# Patient Record
Sex: Female | Born: 1935 | Race: White | Hispanic: No | Smoking: Never smoker
Health system: Southern US, Community
[De-identification: ages and names within clinical notes are randomized; demographics above are authoritative.]

## PROBLEM LIST (undated history)

## (undated) DIAGNOSIS — I4891 Unspecified atrial fibrillation: Secondary | ICD-10-CM

## (undated) DIAGNOSIS — F0390 Unspecified dementia without behavioral disturbance: Secondary | ICD-10-CM

## (undated) DIAGNOSIS — I10 Essential (primary) hypertension: Secondary | ICD-10-CM

## (undated) DIAGNOSIS — E079 Disorder of thyroid, unspecified: Secondary | ICD-10-CM

## (undated) DIAGNOSIS — G459 Transient cerebral ischemic attack, unspecified: Secondary | ICD-10-CM

## (undated) HISTORY — PX: CHOLECYSTECTOMY: SHX55

---

## 2004-10-12 ENCOUNTER — Ambulatory Visit: Payer: Self-pay | Admitting: Ophthalmology

## 2004-10-18 ENCOUNTER — Ambulatory Visit: Payer: Self-pay | Admitting: Ophthalmology

## 2005-12-01 ENCOUNTER — Ambulatory Visit: Payer: Self-pay | Admitting: Internal Medicine

## 2005-12-12 ENCOUNTER — Ambulatory Visit: Payer: Self-pay | Admitting: Internal Medicine

## 2007-10-26 ENCOUNTER — Ambulatory Visit: Payer: Self-pay | Admitting: Orthopedic Surgery

## 2009-06-17 ENCOUNTER — Emergency Department: Payer: Self-pay | Admitting: Emergency Medicine

## 2009-06-18 ENCOUNTER — Inpatient Hospital Stay: Payer: Self-pay | Admitting: Internal Medicine

## 2010-08-06 ENCOUNTER — Emergency Department: Payer: Self-pay | Admitting: Emergency Medicine

## 2010-08-08 ENCOUNTER — Inpatient Hospital Stay: Payer: Self-pay | Admitting: Internal Medicine

## 2010-08-24 ENCOUNTER — Inpatient Hospital Stay: Payer: Self-pay | Admitting: Internal Medicine

## 2010-08-30 ENCOUNTER — Encounter: Payer: Self-pay | Admitting: Internal Medicine

## 2010-09-01 DIAGNOSIS — F0280 Dementia in other diseases classified elsewhere without behavioral disturbance: Secondary | ICD-10-CM

## 2010-09-01 DIAGNOSIS — I10 Essential (primary) hypertension: Secondary | ICD-10-CM

## 2010-09-01 DIAGNOSIS — G309 Alzheimer's disease, unspecified: Secondary | ICD-10-CM

## 2010-09-01 DIAGNOSIS — F329 Major depressive disorder, single episode, unspecified: Secondary | ICD-10-CM

## 2010-09-06 NOTE — Letter (Signed)
Summary: [facility] Face Sheet  [facility] Face Sheet   Imported By: Beau Fanny 09/02/2010 10:40:06  _____________________________________________________________________  External Attachment:    Type:   Image     Comment:   External Document

## 2011-01-31 ENCOUNTER — Ambulatory Visit: Payer: Self-pay | Admitting: Internal Medicine

## 2011-06-20 DIAGNOSIS — I4891 Unspecified atrial fibrillation: Secondary | ICD-10-CM

## 2011-06-20 HISTORY — DX: Unspecified atrial fibrillation: I48.91

## 2014-10-19 ENCOUNTER — Ambulatory Visit
Admission: RE | Admit: 2014-10-19 | Discharge: 2014-10-19 | Disposition: A | Discharge status: Home / Self Care | Payer: Medicare Other | Source: Ambulatory Visit | Attending: Internal Medicine | Admitting: Internal Medicine

## 2014-10-19 ENCOUNTER — Ambulatory Visit
Admission: RE | Admit: 2014-10-19 | Discharge: 2014-10-19 | Disposition: A | Discharge status: Home / Self Care | Payer: Medicare Other | Source: Ambulatory Visit | Attending: *Deleted | Admitting: *Deleted

## 2014-10-19 ENCOUNTER — Other Ambulatory Visit: Payer: Self-pay | Admitting: *Deleted

## 2014-10-19 DIAGNOSIS — R509 Fever, unspecified: Secondary | ICD-10-CM

## 2014-10-19 DIAGNOSIS — R05 Cough: Secondary | ICD-10-CM

## 2014-10-19 DIAGNOSIS — M40204 Unspecified kyphosis, thoracic region: Secondary | ICD-10-CM | POA: Insufficient documentation

## 2014-10-19 DIAGNOSIS — R059 Cough, unspecified: Secondary | ICD-10-CM

## 2014-10-19 DIAGNOSIS — M8580 Other specified disorders of bone density and structure, unspecified site: Secondary | ICD-10-CM | POA: Insufficient documentation

## 2015-02-05 ENCOUNTER — Emergency Department: Payer: Medicare Other

## 2015-02-05 ENCOUNTER — Encounter: Payer: Self-pay | Admitting: *Deleted

## 2015-02-05 ENCOUNTER — Emergency Department
Admission: EM | Admit: 2015-02-05 | Discharge: 2015-02-06 | Disposition: A | Discharge status: Home / Self Care | Payer: Medicare Other | Attending: Emergency Medicine | Admitting: Emergency Medicine

## 2015-02-05 ENCOUNTER — Other Ambulatory Visit: Payer: Self-pay

## 2015-02-05 DIAGNOSIS — G459 Transient cerebral ischemic attack, unspecified: Secondary | ICD-10-CM | POA: Insufficient documentation

## 2015-02-05 DIAGNOSIS — J159 Unspecified bacterial pneumonia: Secondary | ICD-10-CM | POA: Diagnosis not present

## 2015-02-05 DIAGNOSIS — F0390 Unspecified dementia without behavioral disturbance: Secondary | ICD-10-CM | POA: Diagnosis not present

## 2015-02-05 DIAGNOSIS — Z79899 Other long term (current) drug therapy: Secondary | ICD-10-CM | POA: Diagnosis not present

## 2015-02-05 DIAGNOSIS — I4891 Unspecified atrial fibrillation: Secondary | ICD-10-CM | POA: Insufficient documentation

## 2015-02-05 DIAGNOSIS — R4182 Altered mental status, unspecified: Secondary | ICD-10-CM | POA: Diagnosis present

## 2015-02-05 DIAGNOSIS — I10 Essential (primary) hypertension: Secondary | ICD-10-CM | POA: Diagnosis not present

## 2015-02-05 DIAGNOSIS — J189 Pneumonia, unspecified organism: Secondary | ICD-10-CM

## 2015-02-05 HISTORY — DX: Unspecified dementia, unspecified severity, without behavioral disturbance, psychotic disturbance, mood disturbance, and anxiety: F03.90

## 2015-02-05 HISTORY — DX: Disorder of thyroid, unspecified: E07.9

## 2015-02-05 HISTORY — DX: Essential (primary) hypertension: I10

## 2015-02-05 LAB — CBC
HEMATOCRIT: 40.5 % (ref 35.0–47.0)
Hemoglobin: 13.5 g/dL (ref 12.0–16.0)
MCH: 33.2 pg (ref 26.0–34.0)
MCHC: 33.4 g/dL (ref 32.0–36.0)
MCV: 99.3 fL (ref 80.0–100.0)
Platelets: 241 10*3/uL (ref 150–440)
RBC: 4.08 MIL/uL (ref 3.80–5.20)
RDW: 13.5 % (ref 11.5–14.5)
WBC: 9.6 10*3/uL (ref 3.6–11.0)

## 2015-02-05 LAB — COMPREHENSIVE METABOLIC PANEL
ALBUMIN: 3.7 g/dL (ref 3.5–5.0)
ALT: 12 U/L — AB (ref 14–54)
AST: 22 U/L (ref 15–41)
Alkaline Phosphatase: 111 U/L (ref 38–126)
Anion gap: 9 (ref 5–15)
BUN: 11 mg/dL (ref 6–20)
CHLORIDE: 99 mmol/L — AB (ref 101–111)
CO2: 27 mmol/L (ref 22–32)
Calcium: 9.2 mg/dL (ref 8.9–10.3)
Creatinine, Ser: 0.82 mg/dL (ref 0.44–1.00)
GFR calc Af Amer: >60 mL/min (ref >60)
Glucose, Bld: 111 mg/dL — ABNORMAL HIGH (ref 65–99)
POTASSIUM: 3.4 mmol/L — AB (ref 3.5–5.1)
SODIUM: 135 mmol/L (ref 135–145)
Total Bilirubin: 0.2 mg/dL — ABNORMAL LOW (ref 0.3–1.2)
Total Protein: 7.2 g/dL (ref 6.5–8.1)

## 2015-02-05 LAB — URINALYSIS COMPLETE WITH MICROSCOPIC (ARMC ONLY)
BACTERIA UA: NONE SEEN
BILIRUBIN URINE: NEGATIVE
Glucose, UA: NEGATIVE mg/dL
Hgb urine dipstick: NEGATIVE
Ketones, ur: NEGATIVE mg/dL
LEUKOCYTES UA: NEGATIVE
Nitrite: NEGATIVE
PH: 6 (ref 5.0–8.0)
PROTEIN: NEGATIVE mg/dL
SQUAMOUS EPITHELIAL / LPF: NONE SEEN
Specific Gravity, Urine: 1.016 (ref 1.005–1.030)

## 2015-02-05 LAB — TROPONIN I: Troponin I: <0.03 ng/mL (ref <0.031)

## 2015-02-05 NOTE — ED Notes (Signed)
Per EMS report, patient's daughter and staff at [facility] reported patient having increased confusion and slurred speech. Patient did not have slurred speech upon arrival and stated that she just wanted to go to her room because it was past her bedtime. Face is symmetrical. Family reported patient's urine smelled strong.

## 2015-02-06 DIAGNOSIS — G459 Transient cerebral ischemic attack, unspecified: Secondary | ICD-10-CM | POA: Diagnosis not present

## 2015-02-06 LAB — GLUCOSE, CAPILLARY: GLUCOSE-CAPILLARY: 107 mg/dL — AB (ref 65–99)

## 2015-02-06 MED ORDER — DOXYCYCLINE HYCLATE 100 MG PO TABS
100.0000 mg | ORAL_TABLET | Freq: Once | ORAL | Status: AC
  Administered 2015-02-06: 100 mg via ORAL
  Filled 2015-02-06: qty 1

## 2015-02-06 MED ORDER — DOXYCYCLINE HYCLATE 100 MG PO TABS: 100.0000 mg | ORAL_TABLET | Freq: Two times a day (BID) | ORAL | Status: DC

## 2015-02-06 NOTE — ED Provider Notes (Addendum)
[facility] Emergency Department Provider Note  ____________________________________________  Time seen: Approximately 1110pm  I have reviewed the triage vital signs and the nursing notes.   HISTORY  Chief Complaint Altered Mental Status    HPI Casey Sanford is a 81 y.o. female with a history of atrial fibrillation and dementia who presents today with several episodes of slurred speech starting at 8:30 tonight. Her daughter is at the bedside and said that she has been increasingly confused over the week. However, she had several episodes of slurred speech lasting several seconds to several minutes earlier tonight. At this point the patient says that she feels fine without any complaints. The daughter does not notice any slurred speech at this time. However, she says that the patient has been slightly confused. She usually knows her age and is not able to tell it at this time. The daughter said that she did not note any facial droop. The family has had discussions about anticoagulation with age of fibrillation the past and have opted against it because of a greater risk than benefit at this age.   Past Medical History  Diagnosis Date  . Hypertension   . Thyroid disease   . Dementia     There are no active problems to display for this patient.   Past Surgical History  Procedure Laterality Date  . Cholecystectomy      Current Outpatient Rx  Name  Route  Sig  Dispense  Refill  . docusate sodium (COLACE) 100 MG capsule   Oral   Take 100 mg by mouth 2 (two) times daily.         . furosemide (LASIX) 20 MG tablet   Oral   Take 1 tablet by mouth daily.         Marland Kitchen levothyroxine (SYNTHROID, LEVOTHROID) 50 MCG tablet   Oral   Take 1 tablet by mouth daily.         Marland Kitchen losartan (COZAAR) 50 MG tablet   Oral   Take 1 tablet by mouth daily.         Bertram Gala Glycol-Propyl Glycol (SYSTANE) 0.4-0.3 % SOLN   Ophthalmic   Apply 1 drop to eye 3 (three)  times daily.         . Potassium Chloride CR (MICRO-K) 8 MEQ CPCR capsule CR   Oral   Take 1 capsule by mouth daily.         . risperiDONE (RISPERDAL) 0.5 MG tablet   Oral   Take 1 tablet by mouth daily.           Allergies Review of patient's allergies indicates no known allergies.  No family history on file.  Social History Social History  Substance Use Topics  . Smoking status: Never Smoker   . Smokeless tobacco: None  . Alcohol Use: 0.6 oz/week    1 Glasses of wine per week     Comment: nightly    Review of Systems Constitutional: No fever/chills Eyes: No visual changes. ENT: No sore throat. Cardiovascular: Denies chest pain. Respiratory: Denies shortness of breath. Gastrointestinal: No abdominal pain.  No nausea, no vomiting.  No diarrhea.  No constipation. Genitourinary: Negative for dysuria. Musculoskeletal: Negative for back pain. Skin: Negative for rash. Neurological: Negative for headaches, focal weakness or numbness.  10-point ROS otherwise negative.  ____________________________________________   PHYSICAL EXAM:  VITAL SIGNS: ED Triage Vitals  Enc Vitals Group     BP 02/05/15 2141 184/79 mmHg  Pulse Rate 02/05/15 2141 67     Resp 02/05/15 2141 18     Temp 02/05/15 2141 97.8 F (36.6 C)     Temp Source 02/05/15 2141 Oral     SpO2 02/05/15 2141 100 %     Weight 02/05/15 2141 170 lb (77.111 kg)     Height 02/05/15 2141 5\' 3"  (1.6 m)     Head Cir --      Peak Flow --      Pain Score --      Pain Loc --      Pain Edu? --      Excl. in GC? --     Constitutional: Alert and oriented 2. Not oriented to age.. Well appearing and in no acute distress. Eyes: Conjunctivae are normal. PERRL. EOMI. Head: Atraumatic. Nose: No congestion/rhinnorhea. Mouth/Throat: Mucous membranes are moist.  Oropharynx non-erythematous. Neck: No stridor.   Cardiovascular: Normal rate, regular rhythm. Grossly normal heart sounds.  Good peripheral  circulation. Respiratory: Normal respiratory effort.  No retractions. Lungs CTAB. Gastrointestinal: Soft and nontender. No distention. No abdominal bruits. No CVA tenderness. Musculoskeletal: No lower extremity tenderness nor edema.  No joint effusions. Neurologic:  Normal speech and language. No gross focal neurologic deficits are appreciated. No gait instability. No ataxia on finger to nose testing. Skin:  Skin is warm, dry and intact. No rash noted. Psychiatric: Mood and affect are normal. Speech and behavior are normal.  NIH Stroke Scale   Interval: Baseline Person Administering Scale: Arelia Longest  Administer stroke scale items in the order listed. Record performance in each category after each subscale exam. Do not go back and change scores. Follow directions provided for each exam technique. Scores should reflect what the patient does, not what the clinician thinks the patient can do. The clinician should record answers while administering the exam and work quickly. Except where indicated, the patient should not be coached (i.e., repeated requests to patient to make a special effort).   1a  Level of consciousness: 0=alert; keenly responsive  1b. LOC questions:  0=Performs both tasks correctly  1c. LOC commands: 0=Performs both tasks correctly  2.  Best Gaze: 0=normal  3.  Visual: 0=No visual loss  4. Facial Palsy: 0=Normal symmetric movement  5a.  Motor left arm: 0=No drift, limb holds 90 (or 45) degrees for full 10 seconds  5b.  Motor right arm: 0=No drift, limb holds 90 (or 45) degrees for full 10 seconds  6a. motor left leg: 0=No drift, limb holds 90 (or 45) degrees for full 10 seconds  6b  Motor right leg:  0=No drift, limb holds 90 (or 45) degrees for full 10 seconds  7. Limb Ataxia: 0=Absent  8.  Sensory: 0=Normal; no sensory loss  9. Best Language:  0=No aphasia, normal  10. Dysarthria: 0=Normal  11. Extinction and Inattention: 0=No abnormality  12. Distal motor  function: 0=Normal   Total:   0   ____________________________________________   LABS (all labs ordered are listed, but only abnormal results are displayed)  Labs Reviewed  COMPREHENSIVE METABOLIC PANEL - Abnormal; Notable for the following:    Potassium 3.4 (*)    Chloride 99 (*)    Glucose, Bld 111 (*)    ALT 12 (*)    Total Bilirubin 0.2 (*)    All other components within normal limits  URINALYSIS COMPLETEWITH MICROSCOPIC ([facility] ONLY) - Abnormal; Notable for the following:    Color, Urine YELLOW (*)    APPearance CLEAR (*)  All other components within normal limits  CBC  TROPONIN I  CBG MONITORING, ED   ____________________________________________  EKG  ED ECG REPORT I, Brysten Reister,  Teena Irani, the attending physician, personally viewed and interpreted this ECG.   Date: 02/06/2015  EKG Time: 2209  Rate: 79  Rhythm: atrial fibrillation, rate 79  Axis: normal axis.    Intervals:none  ST&T Change: no st elevations or depressions.  No abnormal t wave inversions.    ____________________________________________  RADIOLOGY  Minimal left basilar opacity.  i personally reviewed the chest xray.  No acute findings on the ct of the brain.   ____________________________________________   PROCEDURES    ____________________________________________   INITIAL IMPRESSION / ASSESSMENT AND PLAN / ED COURSE  Pertinent labs & imaging results that were available during my care of the patient were reviewed by me and considered in my medical decision making (see chart for details).  ----------------------------------------- 12:44 AM on 02/06/2015 -----------------------------------------  Patient is resting comfortably and continues to express her wishes to go home. She does not have any slurred speech or focal weakness at this time. She has reassuring lab work. She does have what may be developing pneumonia on her chest x-ray. I discussed with her daughter Casey Sanford as well as her  daughter Casey Sanford who is the power of attorney. The family had a discussion and decided that they will be okay with the patient going back to home without any further workup for stroke or anticoagulation. We discussed that this will mean the patient is at risk for stroke. However, the patient has discussed anticoagulation with her primary care doctor and he agreed that the benefits are less than the risks. Treat the pneumonia. This is possibly a cause for her altered mental status. ____________________________________________   FINAL CLINICAL IMPRESSION(S) / ED DIAGNOSES  Acute TIA. Acute pneumonia. Initial visit.    Myrna Blazer, MD 02/06/15 0045  Course of Doxy not ideal antibiotic. However, given risk profile with Levaquin will not prescribe secondary to multiple serious side effects in this age group.  Myrna Blazer, MD 02/06/15 [id number]

## 2015-06-01 ENCOUNTER — Other Ambulatory Visit: Payer: Self-pay | Admitting: Vascular Surgery

## 2015-06-07 ENCOUNTER — Other Ambulatory Visit
Admission: RE | Admit: 2015-06-07 | Discharge: 2015-06-07 | Disposition: A | Discharge status: Home / Self Care | Payer: Medicare Other | Source: Ambulatory Visit | Attending: Vascular Surgery | Admitting: Vascular Surgery

## 2015-06-07 DIAGNOSIS — I739 Peripheral vascular disease, unspecified: Secondary | ICD-10-CM | POA: Insufficient documentation

## 2015-06-07 LAB — BUN: BUN: 16 mg/dL (ref 6–20)

## 2015-06-07 LAB — CREATININE, SERUM
Creatinine, Ser: 0.9 mg/dL (ref 0.44–1.00)
GFR calc Af Amer: >60 mL/min (ref >60)
GFR, EST NON AFRICAN AMERICAN: 55 mL/min — AB (ref >60)

## 2015-06-09 ENCOUNTER — Ambulatory Visit
Admission: RE | Admit: 2015-06-09 | Discharge: 2015-06-09 | Disposition: A | Discharge status: Home / Self Care | Payer: Medicare Other | Source: Ambulatory Visit | Attending: Vascular Surgery | Admitting: Vascular Surgery

## 2015-06-09 ENCOUNTER — Encounter
Admission: RE | Disposition: A | Discharge status: Home / Self Care | Payer: Self-pay | Source: Ambulatory Visit | Attending: Vascular Surgery

## 2015-06-09 DIAGNOSIS — L97519 Non-pressure chronic ulcer of other part of right foot with unspecified severity: Secondary | ICD-10-CM | POA: Insufficient documentation

## 2015-06-09 DIAGNOSIS — Z79899 Other long term (current) drug therapy: Secondary | ICD-10-CM | POA: Diagnosis not present

## 2015-06-09 DIAGNOSIS — I70235 Atherosclerosis of native arteries of right leg with ulceration of other part of foot: Secondary | ICD-10-CM | POA: Diagnosis not present

## 2015-06-09 DIAGNOSIS — Z87891 Personal history of nicotine dependence: Secondary | ICD-10-CM | POA: Insufficient documentation

## 2015-06-09 DIAGNOSIS — E785 Hyperlipidemia, unspecified: Secondary | ICD-10-CM | POA: Insufficient documentation

## 2015-06-09 DIAGNOSIS — I10 Essential (primary) hypertension: Secondary | ICD-10-CM | POA: Diagnosis not present

## 2015-06-09 HISTORY — PX: PERIPHERAL VASCULAR CATHETERIZATION: SHX172C

## 2015-06-09 SURGERY — LOWER EXTREMITY ANGIOGRAPHY
Anesthesia: Moderate Sedation | Laterality: Right

## 2015-06-09 MED ORDER — FENTANYL CITRATE (PF) 100 MCG/2ML IJ SOLN
INTRAMUSCULAR | Status: DC | PRN
  Administered 2015-06-09 (×2): 25 ug via INTRAVENOUS

## 2015-06-09 MED ORDER — MIDAZOLAM HCL 2 MG/2ML IJ SOLN
INTRAMUSCULAR | Status: AC
  Filled 2015-06-09: qty 4

## 2015-06-09 MED ORDER — MORPHINE SULFATE (PF) 4 MG/ML IV SOLN: 2.0000 mg | INTRAVENOUS | Status: DC | PRN

## 2015-06-09 MED ORDER — SODIUM CHLORIDE 0.9 % IV SOLN
INTRAVENOUS | Status: DC
Start: 2015-06-09 — End: 2015-06-09
  Administered 2015-06-09: 10:00:00 via INTRAVENOUS

## 2015-06-09 MED ORDER — FAMOTIDINE 20 MG PO TABS: 40.0000 mg | ORAL_TABLET | ORAL | Status: DC | PRN

## 2015-06-09 MED ORDER — DEXTROSE 5 % IV SOLN
1.5000 g | INTRAVENOUS | Status: AC
  Administered 2015-06-09: 1.5 g via INTRAVENOUS

## 2015-06-09 MED ORDER — ONDANSETRON HCL 4 MG/2ML IJ SOLN: 4.0000 mg | Freq: Four times a day (QID) | INTRAMUSCULAR | Status: DC | PRN

## 2015-06-09 MED ORDER — MIDAZOLAM HCL 2 MG/2ML IJ SOLN
INTRAMUSCULAR | Status: DC | PRN
  Administered 2015-06-09 (×2): 1 mg via INTRAVENOUS

## 2015-06-09 MED ORDER — CLOPIDOGREL BISULFATE 75 MG PO TABS
ORAL_TABLET | ORAL | Status: AC
  Filled 2015-06-09: qty 4

## 2015-06-09 MED ORDER — HYDROMORPHONE HCL 1 MG/ML IJ SOLN: 1.0000 mg | Freq: Once | INTRAMUSCULAR | Status: DC

## 2015-06-09 MED ORDER — HEPARIN SODIUM (PORCINE) 1000 UNIT/ML IJ SOLN
INTRAMUSCULAR | Status: AC
  Filled 2015-06-09: qty 1

## 2015-06-09 MED ORDER — METHYLPREDNISOLONE SODIUM SUCC 125 MG IJ SOLR: 125.0000 mg | INTRAMUSCULAR | Status: DC | PRN

## 2015-06-09 MED ORDER — CLOPIDOGREL BISULFATE 75 MG PO TABS: 75.0000 mg | ORAL_TABLET | Freq: Every day | ORAL | Status: DC

## 2015-06-09 MED ORDER — LIDOCAINE HCL (PF) 1 % IJ SOLN
INTRAMUSCULAR | Status: AC
  Filled 2015-06-09: qty 30

## 2015-06-09 MED ORDER — IOHEXOL 300 MG/ML  SOLN
INTRAMUSCULAR | Status: DC | PRN
  Administered 2015-06-09: 75 mL via INTRA_ARTERIAL

## 2015-06-09 MED ORDER — ACETAMINOPHEN 325 MG PO TABS: 325.0000 mg | ORAL_TABLET | ORAL | Status: DC | PRN

## 2015-06-09 MED ORDER — ALUM & MAG HYDROXIDE-SIMETH 200-200-20 MG/5ML PO SUSP: 15.0000 mL | ORAL | Status: DC | PRN

## 2015-06-09 MED ORDER — ASPIRIN 81 MG PO CHEW
CHEWABLE_TABLET | ORAL | Status: AC
  Administered 2015-06-09: 81 mg
  Filled 2015-06-09: qty 1

## 2015-06-09 MED ORDER — CLOPIDOGREL BISULFATE 75 MG PO TABS
300.0000 mg | ORAL_TABLET | Freq: Once | ORAL | Status: AC
  Administered 2015-06-09: 300 mg via ORAL

## 2015-06-09 MED ORDER — FENTANYL CITRATE (PF) 100 MCG/2ML IJ SOLN
INTRAMUSCULAR | Status: AC
  Filled 2015-06-09: qty 2

## 2015-06-09 MED ORDER — HEPARIN SODIUM (PORCINE) 1000 UNIT/ML IJ SOLN
INTRAMUSCULAR | Status: DC | PRN
  Administered 2015-06-09: 4000 [IU] via INTRAVENOUS

## 2015-06-09 MED ORDER — ACETAMINOPHEN 325 MG RE SUPP: 325.0000 mg | RECTAL | Status: DC | PRN

## 2015-06-09 MED ORDER — OXYCODONE HCL 5 MG PO TABS: 5.0000 mg | ORAL_TABLET | ORAL | Status: DC | PRN

## 2015-06-09 MED ORDER — HEPARIN (PORCINE) IN NACL 2-0.9 UNIT/ML-% IJ SOLN
INTRAMUSCULAR | Status: AC
  Filled 2015-06-09: qty 1000

## 2015-06-09 SURGICAL SUPPLY — 22 items
BALLN LUTONIX 4X120X130 (BALLOONS) ×3
BALLN ULTRVRSE 018 2.5X100X150 (BALLOONS) ×3
BALLN ULTRVRSE 3X4X130 (BALLOONS) ×2 IMPLANT
BALLOON LUTONIX 4X120X130 (BALLOONS) ×1 IMPLANT
BALLOON ULTRVS 018 2.5X100X150 (BALLOONS) IMPLANT
CATH PIG 70CM (CATHETERS) ×3 IMPLANT
CATH RIM 65CM (CATHETERS) ×3 IMPLANT
CATH STS 5FR 125CM (CATHETERS) ×3 IMPLANT
CATH VERT 100CM (CATHETERS) ×2 IMPLANT
DEVICE PRESTO INFLATION (MISCELLANEOUS) ×3 IMPLANT
DEVICE STARCLOSE SE CLOSURE (Vascular Products) ×2 IMPLANT
DEVICE TORQUE (MISCELLANEOUS) ×3 IMPLANT
GLIDEWIRE ANGLED SS 035X260CM (WIRE) ×3 IMPLANT
PACK ANGIOGRAPHY (CUSTOM PROCEDURE TRAY) ×3 IMPLANT
SET INTRO CAPELLA COAXIAL (SET/KITS/TRAYS/PACK) ×3 IMPLANT
SHEATH BRITE TIP 5FRX11 (SHEATH) ×3 IMPLANT
SHEATH RAABE 6FR (SHEATH) ×3 IMPLANT
SYR MEDRAD MARK V 150ML (SYRINGE) ×3 IMPLANT
TUBING CONTRAST HIGH PRESS 72 (TUBING) ×3 IMPLANT
WIRE G V18X300CM (WIRE) ×3 IMPLANT
WIRE J 3MM .035X145CM (WIRE) ×3 IMPLANT
WIRE MAGIC TORQUE 260C (WIRE) ×3 IMPLANT

## 2015-06-09 NOTE — Op Note (Signed)
[facility] Percutaneous Study/Intervention Procedural Note   Date of Surgery: 06/09/2015  Surgeon:  Katha Cabal, MD.  Pre-operative Diagnosis: Atherosclerotic occlusive disease bilateral lower extremities with ulceration of the right foot  Post-operative diagnosis: Same  Procedure(s) Performed: 1. Introduction catheter into right lower extremity 3rd order catheter placement  2. Contrast injection right lower extremity for distal runoff             3.  Additional third order catheter placement right lower extremity   3. Percutaneous transluminal angioplasty right popliteal to 4 mm with Lutonix balloon 4. Percutaneous transluminal angioplasty right peroneal to 2.5 mm             5.  Percutaneous transluminal angioplasty right anterior tibial artery to 3.0 mm             6.  Star close closure left common femoral arteriotomy   Anesthesia: Conscious sedation with IV Versed and fentanyl  Sheath: 6 French Rabi  Contrast: 75 cc  Fluoroscopy Time: 10.3 minutes  Indications: Casey Sanford presents with atherosclerotic occlusive disease of the lower sternum is with ulceration of the right foot The risks and benefits are reviewed all questions answered patient agrees to proceed.  Procedure: Casey Sanford is a 81 y.o. y.o. female who was identified and appropriate procedural time out was performed. The patient was then placed supine on the table and prepped and draped in the usual sterile fashion.   Ultrasound was placed in the sterile sleeve and the left groin was evaluated the left common femoral artery was echolucent and pulsatile indicating patency.  Image was recorded for the permanent record and under real-time visualization a microneedle was inserted into the common femoral artery microwire followed by a micro-sheath.  A J-wire was then advanced through the micro-sheath and a  5 [place] sheath was  then inserted over a J-wire. J-wire was then advanced and a 5 French pigtail catheter was positioned at the level of T12. AP projection of the aorta was then obtained. Pigtail catheter was repositioned to above the bifurcation and a LAO view of the pelvis was obtained.  Subsequently a rim catheter with the stiff angle Glidewire was used to cross the aortic bifurcation the catheter wire were advanced down into the right distal external iliac artery. Oblique view of the femoral bifurcation was then obtained and subsequently the wire was reintroduced and the pigtail catheter negotiated into the SFA representing third order catheter placement. Distal runoff was then performed.  4000 units of heparin was then given and allowed to circulate and a 6 Fr Rabi sheath was advanced up and over the bifurcation and positioned in the femoral artery  Straight  catheter and a Magic torque were then negotiated down into the distal popliteal.  Distal runoff was then completed by hand injection through the catheter. The wire was then reintroduced and a 4 x 12 Lutonix balloon was used to angioplasty the popliteal artery. Inflation was to 12 atmospheres for 2 minutes. Follow-up imaging demonstrated patency with less than 5% residual stenosis.  Straight catheter was then reintroduced and a VAT wire was exchanged for the Magic torque and negotiated down into the distal peroneal. Hand injection contrast demonstrated intraluminal placement. A 2.5 x 10 all traverse balloon was then inflated across the proximal peroneal. Follow-up imaging through the catheter demonstrated an excellent result with minimal residual stenosis. The catheter was then reintroduced and using a KMP catheter with the VAT wire the wire was  negotiated into the anterior tibial. This represents additional third order catheter placement. Hand injection contrast was then used to verify the lesion within the anterior tibial and distal runoff. A 3 x 4 balloon was then  advanced across the proximal anterior tibial lesion was inflated to 10 atm for a little over 1 minute. Follow-up imaging demonstrated complete resolution of the lesion in the anterior tibial. Distal runoff was then reassessed and was found to be intact.  After review of these images the sheath is pulled into the left external iliac oblique of the common femoral is obtained and a Star close device deployed. There no immediate Complications.  Findings: The abdominal aorta is opacified with a bolus injection contrast. There is diffuse disease with mild dilatation but there are no hemodynamically significant lesions. The common and external iliac arteries are widely patent bilaterally.  The right common femoral and profunda femoris are patent superficial femoral artery demonstrates diffuse disease but is patent throughout it's course there no hemodynamic significant lesions. The popliteal artery demonstrates multiple high-grade lesions in tandem with 1 centered directly behind the knee that represents a subtotal occlusion. These are treated with a Lutonix balloon and a 4 mm diameter with excellent result. The proximal one third to one half of the peroneal is also diffusely diseased with greater than 60% lesions noted and this is treated with an 2.5 mm balloon inflation with excellent result. There is a more isolated focal lesion in the anterior tibial proximally 2 cm downstream from the origin and is treated with a 3 mm balloon inflation with an excellent result.  Summary successful revascularization of the popliteal and 2 tibial vessels as described above   Disposition: Patient was taken to the recovery room in stable condition having tolerated the procedure well.  Casey Sanford, Casey Sanford 03/23/2015,3:14 PM

## 2015-06-09 NOTE — Discharge Instructions (Signed)
Angiogram, Care After °Refer to this sheet in the next few weeks. These instructions provide you with information about caring for yourself after your procedure. Your health care provider may also give you more specific instructions. Your treatment has been planned according to current medical practices, but problems sometimes occur. Call your health care provider if you have any problems or questions after your procedure. °WHAT TO EXPECT AFTER THE PROCEDURE °After your procedure, it is typical to have the following: °· Bruising at the catheter insertion site that usually fades within 1-2 weeks. °· Blood collecting in the tissue (hematoma) that may be painful to the touch. It should usually decrease in size and tenderness within 1-2 weeks. °HOME CARE INSTRUCTIONS °· Take medicines only as directed by your health care provider. °· You may shower 24-48 hours after the procedure or as directed by your health care provider. Remove the bandage (dressing) and gently wash the site with plain soap and water. Pat the area dry with a clean towel. Do not rub the site, because this may cause bleeding. °· Do not take baths, swim, or use a hot tub until your health care provider approves. °· Check your insertion site every day for redness, swelling, or drainage. °· Do not apply powder or lotion to the site. °· Do not lift over 10 lb (4.5 kg) for 5 days after your procedure or as directed by your health care provider. °· Ask your health care provider when it is okay to: °¨ Return to work or school. °¨ Resume usual physical activities or sports. °¨ Resume sexual activity. °· Do not drive home if you are discharged the same day as the procedure. Have someone else drive you. °· You may drive 24 hours after the procedure unless otherwise instructed by your health care provider. °· Do not operate machinery or power tools for 24 hours after the procedure or as directed by your health care provider. °· If your procedure was done as an  outpatient procedure, which means that you went home the same day as your procedure, a responsible adult should be with you for the first 24 hours after you arrive home. °· Keep all follow-up visits as directed by your health care provider. This is important. °SEEK MEDICAL CARE IF: °· You have a fever. °· You have chills. °· You have increased bleeding from the catheter insertion site. Hold pressure on the site. °SEEK IMMEDIATE MEDICAL CARE IF: °· You have unusual pain at the catheter insertion site. °· You have redness, warmth, or swelling at the catheter insertion site. °· You have drainage (other than a small amount of blood on the dressing) from the catheter insertion site. °· The catheter insertion site is bleeding, and the bleeding does not stop after 30 minutes of holding steady pressure on the site. °· The area near or just beyond the catheter insertion site becomes pale, cool, tingly, or numb. °  °This information is not intended to replace advice given to you by your health care provider. Make sure you discuss any questions you have with your health care provider. °  °Document Released: 12/22/2004 Document Revised: 06/26/2014 Document Reviewed: 11/06/2012 °[employer]vier Interactive Patient Education ©2016 [employer] ° °

## 2015-07-12 ENCOUNTER — Encounter: Payer: Medicare Other | Attending: Surgery | Admitting: Surgery

## 2015-07-12 DIAGNOSIS — Z7982 Long term (current) use of aspirin: Secondary | ICD-10-CM | POA: Insufficient documentation

## 2015-07-12 DIAGNOSIS — B359 Dermatophytosis, unspecified: Secondary | ICD-10-CM | POA: Diagnosis not present

## 2015-07-12 DIAGNOSIS — L97211 Non-pressure chronic ulcer of right calf limited to breakdown of skin: Secondary | ICD-10-CM | POA: Diagnosis not present

## 2015-07-12 DIAGNOSIS — I10 Essential (primary) hypertension: Secondary | ICD-10-CM | POA: Diagnosis not present

## 2015-07-12 DIAGNOSIS — Z79899 Other long term (current) drug therapy: Secondary | ICD-10-CM | POA: Diagnosis not present

## 2015-07-12 DIAGNOSIS — I70235 Atherosclerosis of native arteries of right leg with ulceration of other part of foot: Secondary | ICD-10-CM | POA: Insufficient documentation

## 2015-07-12 NOTE — Progress Notes (Signed)
QUINTASIA, THEROUX ([id number]) Visit Report for 07/12/2015 Allergy List Details Patient Name: Casey Sanford, Casey Sanford. Date of Service: 07/12/2015 9:30 AM Medical Record Number: [id number] Patient Account Number: [id number] Date of Birth/Sex: 1923/05/15 (82 y.o. Female) Treating RN: Clover Mealy, RN, BSN, [place] Sink Primary Care Physician: Dewaine Oats Other Clinician: Referring Physician: Dewaine Oats Treating Physician/Extender: Rudene Re in Treatment: 0 Allergies Active Allergies no known allergies Allergy Notes Electronic Signature(s) Signed: 07/12/2015 3:41:33 PM By: Elpidio Eric BSN, RN Entered By: Elpidio Eric on 07/12/2015 09:54:36 Casey Sanford ([id number]) -------------------------------------------------------------------------------- Arrival Information Details Patient Name: Casey Sanford Date of Service: 07/12/2015 9:30 AM Medical Record Number: [id number] Patient Account Number: [id number] Date of Birth/Sex: June 26, 1922 (82 y.o. Female) Treating RN: Clover Mealy, RN, BSN, [place] Sink Primary Care Physician: Dewaine Oats Other Clinician: Referring Physician: Dewaine Oats Treating Physician/Extender: Rudene Re in Treatment: 0 Visit Information Patient Arrived: Cloyde Reams Time: 09:51 Accompanied By: dtr Transfer Assistance: None Patient Identification Verified: Yes Secondary Verification Process Completed: Yes Patient Requires Transmission-Based No Precautions: Patient Has Alerts: No Electronic Signature(s) Signed: 07/12/2015 3:41:33 PM By: Elpidio Eric BSN, RN Entered By: Elpidio Eric on 07/12/2015 09:53:45 Casey Sanford ([id number]) -------------------------------------------------------------------------------- Encounter Discharge Information Details Patient Name: Casey Sanford. Date of Service: 07/12/2015 9:30 AM Medical Record Number: [id number] Patient Account Number: [id number] Date of Birth/Sex: 1922/10/23 (82 y.o. Female) Treating RN: Clover Mealy, RN, BSN, [place] Sink Primary  Care Physician: Dewaine Oats Other Clinician: Referring Physician: Dewaine Oats Treating Physician/Extender: Rudene Re in Treatment: 0 Encounter Discharge Information Items Schedule Follow-up Appointment: No Medication Reconciliation completed No and provided to Patient/Care Star Cheese: Provided on Clinical Summary of Care: 07/12/2015 Form Type Recipient Paper Patient [place] Electronic Signature(s) Signed: 07/12/2015 10:34:14 AM By: Gwenlyn Perking Entered By: Gwenlyn Perking on 07/12/2015 10:34:14 Casey Sanford ([id number]) -------------------------------------------------------------------------------- Lower Extremity Assessment Details Patient Name: Casey Sanford. Date of Service: 07/12/2015 9:30 AM Medical Record Number: [id number] Patient Account Number: [id number] Date of Birth/Sex: May 26, 1923 (82 y.o. Female) Treating RN: Afful, RN, BSN, [place] Primary Care Physician: Dewaine Oats Other Clinician: Referring Physician: Dewaine Oats Treating Physician/Extender: Rudene Re in Treatment: 0 Edema Assessment Assessed: [Left: No] [Right: No] E[Left: dema] [Right: :] Calf Left: Right: Point of Measurement: 32 cm From Medial Instep 33.5 cm 34 cm Ankle Left: Right: Point of Measurement: 10 cm From Medial Instep 21 cm 21 cm Vascular Assessment Claudication: Claudication Assessment [Left:None] [Right:None] Pulses: Posterior Tibial Palpable: [Left:Yes] [Right:Yes] Dorsalis Pedis Palpable: [Left:Yes] [Right:Yes] Doppler: [Right:Monophasic] Extremity colors, hair growth, and conditions: Extremity Color: [Left:Normal] [Right:Normal] Hair Growth on Extremity: [Left:Yes] [Right:Yes] Temperature of Extremity: [Left:Warm] [Right:Warm] Capillary Refill: [Left:< 3 seconds] [Right:< 3 seconds] Blood Pressure: Brachial: [Left:147] [Right:147] Dorsalis Pedis: 60 [Left:Dorsalis Pedis: 120] Ankle: Posterior Tibial: [Left:Posterior Tibial: 0.41] [Right:0.82] Toe Nail  Assessment Left: Right: Thick: No No Discolored: No No Deformed: No No Diedrich, Danaja H. ([id number]) Improper Length and Hygiene: No No Electronic Signature(s) Signed: 07/12/2015 3:41:33 PM By: Elpidio Eric BSN, RN Entered By: Elpidio Eric on 07/12/2015 10:11:46 Casey Sanford ([id number]) -------------------------------------------------------------------------------- Multi Wound Chart Details Patient Name: Casey Sanford. Date of Service: 07/12/2015 9:30 AM Medical Record Number: [id number] Patient Account Number: [id number] Date of Birth/Sex: 06-03-1923 (82 y.o. Female) Treating RN: Clover Mealy, RN, BSN, [place] Sink Primary Care Physician: Dewaine Oats Other Clinician: Referring Physician: Dewaine Oats Treating Physician/Extender: Rudene Re in Treatment: 0 Vital Signs Height(in): 64 Pulse(bpm): 84 Weight(lbs): 143 Blood Pressure 147/55 (mmHg): Body Mass Index(BMI): 25 Temperature(F): 98.1 Respiratory Rate 17 (breaths/min): Photos: [1:No Photos] [  N/A:N/A] Wound Location: [1:Right Toe Great] [N/A:N/A] Wounding Event: [1:Gradually Appeared] [N/A:N/A] Primary Etiology: [1:To be determined] [N/A:N/A] Comorbid History: [1:Cataracts, Hypertension] [N/A:N/A] Date Acquired: [1:11/23/2014] [N/A:N/A] Weeks of Treatment: [1:0] [N/A:N/A] Wound Status: [1:Open] [N/A:N/A] Measurements L x W x D 1.7x2x0.1 [N/A:N/A] (cm) Area (cm) : [1:2.67] [N/A:N/A] Volume (cm) : [1:0.267] [N/A:N/A] % Reduction in Area: [1:0.00%] [N/A:N/A] % Reduction in Volume: 0.00% [N/A:N/A] Classification: [1:Partial Thickness] [N/A:N/A] Exudate Amount: [1:Small] [N/A:N/A] Exudate Type: [1:Serosanguineous] [N/A:N/A] Exudate Color: [1:red, brown] [N/A:N/A] Wound Margin: [1:Distinct, outline attached] [N/A:N/A] Granulation Amount: [1:Medium (34-66%)] [N/A:N/A] Necrotic Amount: [1:Small (1-33%)] [N/A:N/A] Exposed Structures: [1:Fascia: No Fat: No Tendon: No Muscle: No Joint: No Bone: No Limited to Skin  Breakdown] [N/A:N/A] Epithelialization: [1:None] [N/A:N/A] Periwound Skin Texture: Edema: No N/A N/A Excoriation: No Induration: No Callus: No Crepitus: No Fluctuance: No Friable: No Rash: No Scarring: No Periwound Skin Moist: Yes N/A N/A Moisture: Dry/Scaly: Yes Maceration: No Periwound Skin Color: Atrophie Blanche: No N/A N/A Cyanosis: No Ecchymosis: No Erythema: No Hemosiderin Staining: No Mottled: No Pallor: No Rubor: No Temperature: No Abnormality N/A N/A Tenderness on Yes N/A N/A Palpation: Wound Preparation: Ulcer Cleansing: N/A N/A Rinsed/Irrigated with Saline Topical Anesthetic Applied: Other: lidocaine 4% Treatment Notes Electronic Signature(s) Signed: 07/12/2015 3:41:33 PM By: Elpidio Eric BSN, RN Entered By: Elpidio Eric on 07/12/2015 10:24:17 Casey Sanford, Casey Sanford ([id number]) -------------------------------------------------------------------------------- Multi-Disciplinary Care Plan Details Patient Name: Casey Sanford. Date of Service: 07/12/2015 9:30 AM Medical Record Number: [id number] Patient Account Number: [id number] Date of Birth/Sex: 1922-07-31 (82 y.o. Female) Treating RN: Afful, RN, BSN, [place] Sink Primary Care Physician: Dewaine Oats Other Clinician: Referring Physician: Dewaine Oats Treating Physician/Extender: Rudene Re in Treatment: 0 Active Inactive Orientation to the Wound Care Program Nursing Diagnoses: Knowledge deficit related to the wound healing center program Goals: Patient/caregiver will verbalize understanding of the Wound Healing Center Program Date Initiated: 07/12/2015 Goal Status: Active Interventions: Provide education on orientation to the wound center Notes: Wound/Skin Impairment Nursing Diagnoses: Impaired tissue integrity Knowledge deficit related to smoking impact on wound healing Knowledge deficit related to ulceration/compromised skin integrity Goals: Patient/caregiver will verbalize understanding of skin  care regimen Date Initiated: 07/12/2015 Goal Status: Active Ulcer/skin breakdown will have a volume reduction of 30% by week 4 Date Initiated: 07/12/2015 Goal Status: Active Ulcer/skin breakdown will have a volume reduction of 50% by week 8 Date Initiated: 07/12/2015 Goal Status: Active Ulcer/skin breakdown will heal within 14 weeks Date Initiated: 07/12/2015 Goal Status: Active Interventions: Casey Sanford, Casey Sanford ([id number]) Assess patient/caregiver ability to obtain necessary supplies Assess patient/caregiver ability to perform ulcer/skin care regimen upon admission and as needed Assess ulceration(s) every visit Provide education on ulcer and skin care Treatment Activities: Referred to DME Ermias Tomeo for dressing supplies : 07/12/2015 Skin care regimen initiated : 07/12/2015 Topical wound management initiated : 07/12/2015 Notes: Electronic Signature(s) Signed: 07/12/2015 3:41:33 PM By: Elpidio Eric BSN, RN Entered By: Elpidio Eric on 07/12/2015 10:24:03 Casey Sanford ([id number]) -------------------------------------------------------------------------------- Pain Assessment Details Patient Name: Casey Sanford. Date of Service: 07/12/2015 9:30 AM Medical Record Number: [id number] Patient Account Number: [id number] Date of Birth/Sex: 1922-10-08 (82 y.o. Female) Treating RN: Clover Mealy, RN, BSN, [place] Sink Primary Care Physician: Dewaine Oats Other Clinician: Referring Physician: Dewaine Oats Treating Physician/Extender: Rudene Re in Treatment: 0 Active Problems Location of Pain Severity and Description of Pain Patient Has Paino No Site Locations Pain Management and Medication Current Pain Management: Electronic Signature(s) Signed: 07/12/2015 3:41:33 PM By: Elpidio Eric BSN, RN Entered By: Elpidio Eric on 07/12/2015 09:54:15 Cliff, Olimpia  Rexene Edison ([id number]) -------------------------------------------------------------------------------- Wound Assessment Details Patient Name: Casey Sanford, Casey Sanford. Date of Service: 07/12/2015 9:30 AM Medical Record Number: [id number] Patient Account Number: [id number] Date of Birth/Sex: 17-Mar-1923 (82 y.o. Female) Treating RN: Clover Mealy, RN, BSN, [place] Sink Primary Care Physician: Dewaine Oats Other Clinician: Referring Physician: Dewaine Oats Treating Physician/Extender: Rudene Re in Treatment: 0 Wound Status Wound Number: 1 Primary Etiology: To be determined Wound Location: Right Toe Great Wound Status: Open Wounding Event: Gradually Appeared Comorbid History: Cataracts, Hypertension Date Acquired: 11/23/2014 Weeks Of Treatment: 0 Clustered Wound: No Photos Photo Uploaded By: Elpidio Eric on 07/12/2015 14:48:40 Wound Measurements Length: (cm) 1.7 Width: (cm) 2 Depth: (cm) 0.1 Area: (cm) 2.67 Volume: (cm) 0.267 % Reduction in Area: 0% % Reduction in Volume: 0% Epithelialization: None Tunneling: No Undermining: No Wound Description Classification: Partial Thickness Wound Margin: Distinct, outline attached Exudate Amount: Small Exudate Type: Serosanguineous Exudate Color: red, brown Foul Odor After Cleansing: No Wound Bed Granulation Amount: Medium (34-66%) Exposed Structure Necrotic Amount: Small (1-33%) Fascia Exposed: No Necrotic Quality: Adherent Slough Fat Layer Exposed: No Tendon Exposed: No Eduardo, Onya H. ([id number]) Muscle Exposed: No Joint Exposed: No Bone Exposed: No Limited to Skin Breakdown Periwound Skin Texture Texture Color No Abnormalities Noted: No No Abnormalities Noted: No Callus: No Atrophie Blanche: No Crepitus: No Cyanosis: No Excoriation: No Ecchymosis: No Fluctuance: No Erythema: No Friable: No Hemosiderin Staining: No Induration: No Mottled: No Localized Edema: No Pallor: No Rash: No Rubor: No Scarring: No Temperature / Pain Moisture Temperature: No Abnormality No Abnormalities Noted: No Tenderness on Palpation: Yes Dry / Scaly: Yes Maceration: No Moist: Yes Wound  Preparation Ulcer Cleansing: Rinsed/Irrigated with Saline Topical Anesthetic Applied: Other: lidocaine 4%, Electronic Signature(s) Signed: 07/12/2015 3:41:33 PM By: Elpidio Eric BSN, RN Entered By: Elpidio Eric on 07/12/2015 10:06:30 Casey Sanford ([id number]) -------------------------------------------------------------------------------- Vitals Details Patient Name: Casey Sanford Date of Service: 07/12/2015 9:30 AM Medical Record Number: [id number] Patient Account Number: [id number] Date of Birth/Sex: [birth year] (82 y.o. Female) Treating RN: Afful, RN, BSN, [place] Sink Primary Care Physician: Dewaine Oats Other Clinician: Referring Physician: Dewaine Oats Treating Physician/Extender: Rudene Re in Treatment: 0 Vital Signs Time Taken: 09:58 Temperature (F): 98.1 Height (in): 64 Pulse (bpm): 84 Source: Stated Respiratory Rate (breaths/min): 17 Weight (lbs): 143 Blood Pressure (mmHg): 147/55 Source: Measured Reference Range: 80 - 120 mg / dl Body Mass Index (BMI): 24.5 Electronic Signature(s) Signed: 07/12/2015 3:41:33 PM By: Elpidio Eric BSN, RN Entered By: Elpidio Eric on 07/12/2015 09:58:19

## 2015-07-12 NOTE — Progress Notes (Signed)
Casey, Sanford ([id number]) Visit Report for 07/12/2015 Chief Complaint Document Details Patient Name: Casey Sanford, Casey Sanford. Date of Service: 07/12/2015 9:30 AM Medical Record Patient Account Number: [id number] Number: Afful, RN, BSN, Treating RN: 1923/04/26 (82 y.o. [place] S[place] Date of Birth/Sex: Female) Other Clinician: Primary Care Physician: TATE, DENNY Treating Evlyn Kanner Referring Physician: Dewaine Oats Physician/Extender: Tania Ade in Treatment: 0 Information Obtained from: Patient Chief Complaint Patient visit today for follow-up of arterial ulceration her right big toe which she's had for about 7 months. Electronic Signature(s) Signed: 07/12/2015 10:38:14 AM By: Evlyn Kanner MD, FACS Entered By: Evlyn Kanner on 07/12/2015 10:38:14 AIRAM, RUNIONS ([id number]) -------------------------------------------------------------------------------- HPI Details Patient Name: Casey, Sanford 07/12/2015 9:30 Date of Service: AM Medical Record [id number] Number: Patient Account Number: [id number] January 12, 1923 (82 y.o. Treating RN: Leonard Downing Date of Birth/Sex: Female) Other Clinician: Primary Care Physician: TATE, DENNY Treating Evlyn Kanner Referring Physician: Dewaine Oats Physician/Extender: Weeks in Treatment: 0 History of Present Illness Location: right big toe Quality: Patient reports No Pain. Severity: Patient states wound (s) are getting better. Duration: Patient has had the wound for > 7 months prior to seeking treatment at the wound center Context: The wound appeared gradually over time Modifying Factors: Other treatment(s) tried include:removal of her right great toenail because of fungal infection and has recently had a angioplasty of the blood vessels of the right lower extremity. Associated Signs and Symptoms: Patient reports having difficulty standing for long periods. HPI Description: 82 year old patient has been referred to [place] by her podiatrist Dr. Gwyneth Revels with the right great toe nonhealing wound which has been there for 7 months. The patient has been seeing Dr. Ether Griffins for the same and he has tried multiple treatments including topical antibiotics wound care dressing as well as oral antibiotics. She has also had stenting of her lower extremity with increased circulation. x-ray of the right foot done on 05/25/2015 shows no acute fracture, and no erosive changes or space- occupying lesions on the right great toe. On review of her electronic records with Epic, I note that she was diagnosed with atherosclerotic occlusive disease bilateral lower extremities with ulceration of the right foot and was taken up for surgery by Dr. Gilda Crease on 06/09/2015. he did a angioplasty of the right popliteal, right peroneal and right anterior tibial. past medical history significant for paronychia right great toe, hyperlipidemia and hypertension and has had no surgery in the past. She has never been a smoker. recent notes were reviewed from the vascular office on 07/08/2015 she had a ABI done which showed the right lower extremity is 1.0 and the left lower extremity is 0.89. He recommended no further tests or studies and regimen recommended graduated compression stockings of the 10-15 mm stent to control mild edema. Electronic Signature(s) Signed: 07/12/2015 10:40:20 AM By: Evlyn Kanner MD, FACS Previous Signature: 07/12/2015 10:32:10 AM Version By: Evlyn Kanner MD, FACS Previous Signature: 07/12/2015 10:16:25 AM Version By: Evlyn Kanner MD, FACS Previous Signature: 07/12/2015 10:11:57 AM Version By: Evlyn Kanner MD, FACS Entered By: Evlyn Kanner on 07/12/2015 10:40:20 JAYLEN, CLAUDE ([id number]) -------------------------------------------------------------------------------- Physical Exam Details Patient Name: Casey, [name] Date of Service: 07/12/2015 9:30 AM Medical Record Patient Account Number: [id number] Number: Afful, RN, BSN, Treating  RN: March 04, 1923 (82 y.o. [place] Sink Date of Birth/Sex: Female) Other Clinician: Primary Care Physician: TATE, DENNY Treating Evlyn Kanner Referring Physician: TATE, Katherina Right Physician/Extender: Weeks in Treatment: 0 Constitutional . Pulse regular. Respirations normal and unlabored. Afebrile. . Eyes Nonicteric. Reactive to light.  Ears, Nose, Mouth, and Throat Lips, teeth, and gums WNL.Marland Kitchen Moist mucosa without lesions. Neck supple and nontender. No palpable supraclavicular or cervical adenopathy. Normal sized without goiter. Respiratory WNL. No retractions.. Cardiovascular Pedal Pulses WNL. recent ABI measurements at the vascular office showed a right ABI of 1.0 and the left ABI of 0.89. No clubbing, cyanosis or edema. Gastrointestinal (GI) Abdomen without masses or tenderness.. No liver or spleen enlargement or tenderness.. Lymphatic No adneopathy. No adenopathy. No adenopathy. Musculoskeletal Adexa without tenderness or enlargement.. Digits and nails w/o clubbing, cyanosis, infection, petechiae, ischemia, or inflammatory conditions.. Integumentary (Hair, Skin) No suspicious lesions. No crepitus or fluctuance. No peri-wound warmth or erythema. No masses.Marland Kitchen Psychiatric Judgement and insight Intact.. No evidence of depression, anxiety, or agitation.. Notes right big toe shows that she has had chronic nailbed infection where there is no nail and there is a mild film covering it and no open ulceration. There are small micro-ulcerations and there are some blebs which show some serous sanguinous fluid. No gross cellulitis or inflammation of the forefoot. Electronic Signature(s) Signed: 07/12/2015 10:42:03 AM By: Evlyn Kanner MD, FACS Entered By: Evlyn Kanner on 07/12/2015 10:42:02 LANDON, TRUAX ([id number], ROCHFORD ([id number]) -------------------------------------------------------------------------------- Physician Orders Details Patient Name: Casey Sanford. Date of Service:  07/12/2015 9:30 AM Medical Record Patient Account Number: [id number] Number: Afful, RN, BSN, Treating RN: Dec 31, 1922 (82 y.o. [place] Sink Date of Birth/Sex: Female) Other Clinician: Primary Care Physician: TATE, DENNY Treating Evlyn Kanner Referring Physician: Dewaine Oats Physician/Extender: Tania Ade in Treatment: 0 Verbal / Phone Orders: Yes Clinician: Afful, RN, BSN, Rita Read Back and Verified: Yes Diagnosis Coding Wound Cleansing Wound #1 Right Toe Great o Clean wound with Normal Saline. Anesthetic Wound #1 Right Toe Great o Topical Lidocaine 4% cream applied to wound bed prior to debridement Primary Wound Dressing Wound #1 Right Toe Great o Aquacel Ag Secondary Dressing Wound #1 Right Toe Great o Gauze and Kerlix/Conform Dressing Change Frequency Wound #1 Right Toe Great o Change dressing every other day. Follow-up Appointments Wound #1 Right Toe Great o Return Appointment in 1 week. Electronic Signature(s) Signed: 07/12/2015 3:41:33 PM By: Elpidio Eric BSN, RN Signed: 07/12/2015 4:13:39 PM By: Evlyn Kanner MD, FACS Entered By: Elpidio Eric on 07/12/2015 10:25:11 MAIRANY, BRUNO ([id number]) -------------------------------------------------------------------------------- Problem List Details Patient Name: Casey Sanford. Date of Service: 07/12/2015 9:30 AM Medical Record Patient Account Number: [id number] Number: Afful, RN, BSN, Treating RN: 01/24/1923 (82 y.o.  Sink Date of Birth/Sex: Female) Other Clinician: Primary Care Physician: TATE, Katherina Right Treating Evlyn Kanner Referring Physician: Dewaine Oats Physician/Extender: Weeks in Treatment: 0 Active Problems ICD-10 Encounter Code Description Active Date Diagnosis I70.235 Atherosclerosis of native arteries of right leg with 07/12/2015 Yes ulceration of other part of foot L97.211 Non-pressure chronic ulcer of right calf limited to 07/12/2015 Yes breakdown of skin B35.9 Dermatophytosis,  unspecified 07/12/2015 Yes Inactive Problems Resolved Problems Electronic Signature(s) Signed: 07/12/2015 10:37:42 AM By: Evlyn Kanner MD, FACS Previous Signature: 07/12/2015 10:36:30 AM Version By: Evlyn Kanner MD, FACS Entered By: Evlyn Kanner on 07/12/2015 10:37:42 Casey Sanford ([id number]) -------------------------------------------------------------------------------- Progress Note Details Patient Name: Casey Sanford. Date of Service: 07/12/2015 9:30 AM Medical Record Patient Account Number: [id number] Number: Afful, RN, BSN, Treating RN: Sep 24, 1922 (82 y.o.  Sink Date of Birth/Sex: Female) Other Clinician: Primary Care Physician: TATE, DENNY Treating Evlyn Kanner Referring Physician: Dewaine Oats Physician/Extender: Tania Ade in Treatment: 0 Subjective Chief Complaint Information obtained from Patient Patient visit today for follow-up of arterial ulceration her right  big toe which she's had for about 7 months. History of Present Illness (HPI) The following HPI elements were documented for the patient's wound: Location: right big toe Quality: Patient reports No Pain. Severity: Patient states wound (s) are getting better. Duration: Patient has had the wound for > 7 months prior to seeking treatment at the wound center Context: The wound appeared gradually over time Modifying Factors: Other treatment(s) tried include:removal of her right great toenail because of fungal infection and has recently had a angioplasty of the blood vessels of the right lower extremity. Associated Signs and Symptoms: Patient reports having difficulty standing for long periods. 82 year old patient has been referred to [place] by her podiatrist Dr. Gwyneth Revels with the right great toe nonhealing wound which has been there for 7 months. The patient has been seeing Dr. Ether Griffins for the same and he has tried multiple treatments including topical antibiotics wound care dressing as well as  oral antibiotics. She has also had stenting of her lower extremity with increased circulation. x-ray of the right foot done on 05/25/2015 shows no acute fracture, and no erosive changes or space- occupying lesions on the right great toe. On review of her electronic records with Epic, I note that she was diagnosed with atherosclerotic occlusive disease bilateral lower extremities with ulceration of the right foot and was taken up for surgery by Dr. Gilda Crease on 06/09/2015. he did a angioplasty of the right popliteal, right peroneal and right anterior tibial. past medical history significant for paronychia right great toe, hyperlipidemia and hypertension and has had no surgery in the past. She has never been a smoker. recent notes were reviewed from the vascular office on 07/08/2015 she had a ABI done which showed the right lower extremity is 1.0 and the left lower extremity is 0.89. He recommended no further tests or studies and regimen recommended graduated compression stockings of the 10-15 mm stent to control mild edema. Wound History Patient presents with 1 open wound that has been present for approximately 7months. Patient has been treating wound in the following manner: dry dressing. Laboratory tests have not been performed in the last month. Patient reportedly has not tested positive for an antibiotic resistant organism. Patient reportedly has not tested positive for osteomyelitis. Patient reportedly has had testing performed to evaluate circulation in Paolillo, Trinika H. ([id number]) the legs. Patient History Information obtained from Caregiver, Chart. Allergies no known allergies Family History Diabetes - Mother, Heart Disease - Father, Hypertension - Mother, Father, No family history of Cancer, Hereditary Spherocytosis, Kidney Disease, Lung Disease, Seizures, Stroke, Thyroid Problems, Tuberculosis. Social History Never smoker, Marital Status - Widowed, Alcohol Use - Rarely, Drug Use -  No History, Caffeine Use - Daily. Medical History Eyes Patient has history of Cataracts Ear/Nose/Mouth/Throat Denies history of Chronic sinus problems/congestion, Middle ear problems Hematologic/Lymphatic Denies history of Anemia, Hemophilia, Human Immunodeficiency Virus, Lymphedema, Sickle Cell Disease Respiratory Denies history of Aspiration, Asthma, Chronic Obstructive Pulmonary Disease (COPD), Pneumothorax, Sleep Apnea, Tuberculosis Cardiovascular Patient has history of Hypertension Gastrointestinal Denies history of Cirrhosis , Colitis, Crohn s, Hepatitis A, Hepatitis B, Hepatitis C Endocrine Denies history of Type I Diabetes, Type II Diabetes Genitourinary Denies history of End Stage Renal Disease Immunological Denies history of Lupus Erythematosus, Raynaud s, Scleroderma Integumentary (Skin) Denies history of History of Burn, History of pressure wounds Musculoskeletal Denies history of Gout, Rheumatoid Arthritis, Osteoarthritis, Osteomyelitis Neurologic Denies history of Dementia, Neuropathy, Quadriplegia, Paraplegia, Seizure Disorder Oncologic Denies history of Received Chemotherapy, Received Radiation Psychiatric Denies history of  Anorexia/bulimia, Confinement Anxiety Review of Systems (ROS) Constitutional Symptoms (General Health) The patient has no complaints or symptoms. TAYLER, HEIDEN ([id number]) Eyes The patient has no complaints or symptoms. Ear/Nose/Mouth/Throat The patient has no complaints or symptoms. Hematologic/Lymphatic The patient has no complaints or symptoms. Respiratory The patient has no complaints or symptoms. Cardiovascular The patient has no complaints or symptoms. Gastrointestinal The patient has no complaints or symptoms. Endocrine The patient has no complaints or symptoms. Genitourinary The patient has no complaints or symptoms. Immunological The patient has no complaints or symptoms. Integumentary (Skin) Complains or has  symptoms of Wounds. Musculoskeletal The patient has no complaints or symptoms. Neurologic The patient has no complaints or symptoms. Oncologic The patient has no complaints or symptoms. Psychiatric The patient has no complaints or symptoms. Medications Aspirin Low Dose 81 mg tablet,delayed release oral tablet,delayed release (DR/EC) oral daily losartan 50 mg tablet oral tablet oral daily risperidone 0.5 mg tablet oral tablet oral nightly Systane Balance 0.6 % eye drops ophthalmic drops ophthalmic n right eye Robafen 100 mg/5 mL oral liquid oral liquid oral every 6 hours as needed docusate sodium 100 mg capsule oral capsule oral every other day furosemide 20 mg tablet oral tablet oral daily clopidogrel 75 mg tablet oral tablet oral daily potassium chloride ER 8 mEq capsule,extended release oral capsule, extended release oral daily levothyroxine 50 mcg tablet oral tablet oral daily mupirocin 2 % topical ointment topical ointment topical to right toe daily hydrogen peroxide 3 % topical spray topical apply to red areas to right leg as needed Objective Gapinski, Tearia H. ([id number]) Constitutional Pulse regular. Respirations normal and unlabored. Afebrile. Vitals Time Taken: 9:58 AM, Height: 64 in, Source: Stated, Weight: 143 lbs, Source: Measured, BMI: 24.5, Temperature: 98.1 F, Pulse: 84 bpm, Respiratory Rate: 17 breaths/min, Blood Pressure: 147/55 mmHg. Eyes Nonicteric. Reactive to light. Ears, Nose, Mouth, and Throat Lips, teeth, and gums WNL.Marland Kitchen Moist mucosa without lesions. Neck supple and nontender. No palpable supraclavicular or cervical adenopathy. Normal sized without goiter. Respiratory WNL. No retractions.. Cardiovascular Pedal Pulses WNL. recent ABI measurements at the vascular office showed a right ABI of 1.0 and the left ABI of 0.89. No clubbing, cyanosis or edema. Gastrointestinal (GI) Abdomen without masses or tenderness.. No liver or spleen enlargement or  tenderness.. Lymphatic No adneopathy. No adenopathy. No adenopathy. Musculoskeletal Adexa without tenderness or enlargement.. Digits and nails w/o clubbing, cyanosis, infection, petechiae, ischemia, or inflammatory conditions.Marland Kitchen Psychiatric Judgement and insight Intact.. No evidence of depression, anxiety, or agitation.. General Notes: right big toe shows that she has had chronic nailbed infection where there is no nail and there is a mild film covering it and no open ulceration. There are small micro-ulcerations and there are some blebs which show some serous sanguinous fluid. No gross cellulitis or inflammation of the forefoot. Integumentary (Hair, Skin) No suspicious lesions. No crepitus or fluctuance. No peri-wound warmth or erythema. No masses.. Wound #1 status is Open. Original cause of wound was Gradually Appeared. The wound is located on the Right Toe Great. The wound measures 1.7cm length x 2cm width x 0.1cm depth; 2.67cm^2 area and 0.267cm^3 volume. The wound is limited to skin breakdown. There is no tunneling or undermining noted. There is a small amount of serosanguineous drainage noted. The wound margin is distinct with the outline attached to the wound base. There is medium (34-66%) granulation within the wound bed. There is a small Ibarra, Tametha H. ([id number]) (1-33%) amount of necrotic tissue within the wound bed including Adherent Slough.  The periwound skin appearance exhibited: Dry/Scaly, Moist. The periwound skin appearance did not exhibit: Callus, Crepitus, Excoriation, Fluctuance, Friable, Induration, Localized Edema, Rash, Scarring, Maceration, Atrophie Blanche, Cyanosis, Ecchymosis, Hemosiderin Staining, Mottled, Pallor, Rubor, Erythema. Periwound temperature was noted as No Abnormality. The periwound has tenderness on palpation. Assessment Active Problems ICD-10 I70.235 - Atherosclerosis of native arteries of right leg with ulceration of other part of foot L97.211 -  Non-pressure chronic ulcer of right calf limited to breakdown of skin B35.9 - Dermatophytosis, unspecified Pleasant 82 year old patient who recently has had a vascular workup an angioplasty and her circulation has been found to be normal. She had paronychia of the right big toe and Dr. Ether Griffins as appropriately treated her over the last several months and her nail has been removed. there is no gross inflammation at the present time and I have recommended silver alginate dressing and appropriate footwear to offload. Local care has been discussed with her daughter was at the bedside and the patient will see as back next week. Plan Wound Cleansing: Wound #1 Right Toe Great: Clean wound with Normal Saline. Anesthetic: Wound #1 Right Toe Great: Topical Lidocaine 4% cream applied to wound bed prior to debridement Primary Wound Dressing: Wound #1 Right Toe Great: Aquacel Ag Secondary Dressing: Wound #1 Right Toe Great: Gauze and Kerlix/Conform Dressing Change Frequency: Wound #1 Right Toe Great: Change dressing every other day. Follow-up Appointments: DANEEN, VOLCY ([id number]) Wound #1 Right Toe Great: Return Appointment in 1 week. Pleasant 82 year old patient who recently has had a vascular workup an angioplasty and her circulation has been found to be normal. She had paronychia of the right big toe and Dr. Ether Griffins as appropriately treated her over the last several months and her nail has been removed. there is no gross inflammation at the present time and I have recommended silver alginate dressing and appropriate footwear to offload. Local care has been discussed with her daughter was at the bedside and the patient will see as back next week. Electronic Signature(s) Signed: 07/12/2015 10:43:48 AM By: Evlyn Kanner MD, FACS Entered By: Evlyn Kanner on 07/12/2015 10:43:48 Casey Sanford  ([id number]) -------------------------------------------------------------------------------- ROS/PFSH Details Patient Name: Casey Sanford. Date of Service: 07/12/2015 9:30 AM Medical Record Patient Account Number: [id number] Number: Afful, RN, BSN, Treating RN: 07/02/1922 (82 y.o. [place] Sink Date of Birth/Sex: Female) Other Clinician: Primary Care Physician: TATE, DENNY Treating Minetta Krisher Referring Physician: Dewaine Oats Physician/Extender: Weeks in Treatment: 0 Information Obtained From Caregiver Chart Wound History Do you currently have one or more open woundso Yes How many open wounds do you currently haveo 1 Approximately how long have you had your woundso 7months How have you been treating your wound(s) until nowo dry dressing Has your wound(s) ever healed and then re-openedo No Have you had any lab work done in the past montho No Have you tested positive for an antibiotic resistant organism (MRSA, VRE)o No Have you tested positive for osteomyelitis (bone infection)o No Have you had any tests for circulation on your legso Yes Who ordered the testo DR. Schneir Integumentary (Skin) Complaints and Symptoms: Positive for: Wounds Medical History: Negative for: History of Burn; History of pressure wounds Constitutional Symptoms (General Health) Complaints and Symptoms: No Complaints or Symptoms Eyes Complaints and Symptoms: No Complaints or Symptoms Medical History: Positive for: Cataracts Ear/Nose/Mouth/Throat Complaints and Symptoms: No Complaints or Symptoms Moon, Reaghan H. ([id number]) Medical History: Negative for: Chronic sinus problems/congestion; Middle ear problems Hematologic/Lymphatic Complaints and Symptoms: No Complaints or Symptoms Medical History:  Negative for: Anemia; Hemophilia; Human Immunodeficiency Virus; Lymphedema; Sickle Cell Disease Respiratory Complaints and Symptoms: No Complaints or Symptoms Medical History: Negative for:  Aspiration; Asthma; Chronic Obstructive Pulmonary Disease (COPD); Pneumothorax; Sleep Apnea; Tuberculosis Cardiovascular Complaints and Symptoms: No Complaints or Symptoms Medical History: Positive for: Hypertension Gastrointestinal Complaints and Symptoms: No Complaints or Symptoms Medical History: Negative for: Cirrhosis ; Colitis; Crohnos; Hepatitis A; Hepatitis B; Hepatitis C Endocrine Complaints and Symptoms: No Complaints or Symptoms Medical History: Negative for: Type I Diabetes; Type II Diabetes Genitourinary Complaints and Symptoms: No Complaints or Symptoms Medical History: Negative for: End Stage Renal Disease Winebarger, Tammie H. ([id number]) Immunological Complaints and Symptoms: No Complaints or Symptoms Medical History: Negative for: Lupus Erythematosus; Raynaudos; Scleroderma Musculoskeletal Complaints and Symptoms: No Complaints or Symptoms Medical History: Negative for: Gout; Rheumatoid Arthritis; Osteoarthritis; Osteomyelitis Neurologic Complaints and Symptoms: No Complaints or Symptoms Medical History: Negative for: Dementia; Neuropathy; Quadriplegia; Paraplegia; Seizure Disorder Oncologic Complaints and Symptoms: No Complaints or Symptoms Medical History: Negative for: Received Chemotherapy; Received Radiation Psychiatric Complaints and Symptoms: No Complaints or Symptoms Medical History: Negative for: Anorexia/bulimia; Confinement Anxiety HBO Extended History Items Eyes: Cataracts Family and Social History Cancer: No; Diabetes: Yes - Mother; Heart Disease: Yes - Father; Hereditary Spherocytosis: No; Hypertension: Yes - Mother, Father; Kidney Disease: No; Lung Disease: No; Seizures: No; Stroke: No; Thyroid Problems: No; Tuberculosis: No; Never smoker; Marital Status - Widowed; Alcohol Use: Rarely; Drug Use: No History; Caffeine Use: Daily; Financial Concerns: No; Food, Clothing or Shelter Needs: No; Support System Lacking: No; Transportation  Concerns: No; Advanced Directives: No; Patient does not want information on Advanced Directives; Living Will: Yes (Not Provided) DAYLIN, EADS ([id number]) Physician Affirmation I have reviewed and agree with the above information. Electronic Signature(s) Signed: 07/12/2015 10:08:33 AM By: Evlyn Kanner MD, FACS Signed: 07/12/2015 3:41:33 PM By: Elpidio Eric BSN, RN Entered By: Evlyn Kanner on 07/12/2015 10:08:31 Casey Sanford ([id number]) -------------------------------------------------------------------------------- SuperBill Details Patient Name: Casey Sanford. Date of Service: 07/12/2015 Medical Record Patient Account Number: [id number] Number: Afful, RN, BSN, Treating RN: Mar 15, 1923 (82 y.o.  S[place] Date of Birth/Sex: Female) Other Clinician: Primary Care Physician: TATE, Katherina Right Treating Evlyn Kanner Referring Physician: Dewaine Oats Physician/Extender: Weeks in Treatment: 0 Diagnosis Coding ICD-10 Codes Code Description I70.235 Atherosclerosis of native arteries of right leg with ulceration of other part of foot L97.211 Non-pressure chronic ulcer of right calf limited to breakdown of skin B35.9 Dermatophytosis, unspecified Physician Procedures CPT4: Description Modifier Quantity Code 0865784 [id number] - WC PHYS LEVEL 4 - NEW PT 1 ICD-10 Description Diagnosis I70.235 Atherosclerosis of native arteries of right leg with ulceration of other part of foot L97.211 Non-pressure chronic ulcer of right  calf limited to breakdown of skin B35.9 Dermatophytosis, unspecified Electronic Signature(s) Signed: 07/12/2015 10:44:00 AM By: Evlyn Kanner MD, FACS Entered By: Evlyn Kanner on 07/12/2015 10:44:00

## 2015-07-12 NOTE — Progress Notes (Signed)
NATAHSA, MARIAN ([id number]) Visit Report for 07/12/2015 Abuse/Suicide Risk Screen Details Patient Name: Casey Sanford, Casey Sanford. Date of Service: 07/12/2015 9:30 AM Medical Record Patient Account Number: [id number] Number: Afful, RN, BSN, Treating RN: 1922-10-07 (82 y.o. [place] Date of Birth/Sex: Female) Other Clinician: Primary Care Physician: TATE, DENNY Treating Britto, Errol Referring Physician: Dewaine Oats Physician/Extender: Weeks in Treatment: 0 Abuse/Suicide Risk Screen Items Answer ABUSE/SUICIDE RISK SCREEN: Has anyone close to you tried to hurt or harm you recentlyo No Do you feel uncomfortable with anyone in your familyo No Has anyone forced you do things that you didnot want to doo No Do you have any thoughts of harming yourselfo No Patient displays signs or symptoms of abuse and/or neglect. No Electronic Signature(s) Signed: 07/12/2015 3:41:33 PM By: Elpidio Eric BSN, RN Entered By: Elpidio Eric on 07/12/2015 09:54:46 Casey Sanford ([id number]) -------------------------------------------------------------------------------- Activities of Daily Living Details Patient Name: Casey Sanford, Casey H. Date of Service: 07/12/2015 9:30 AM Medical Record Patient Account Number: [id number] Number: Afful, RN, BSN, Treating RN: 12/18/1922 (82 y.o. [place] Date of Birth/Sex: Female) Other Clinician: Primary Care Physician: TATE, DENNY Treating Evlyn Kanner Referring Physician: Dewaine Oats Physician/Extender: Weeks in Treatment: 0 Activities of Daily Living Items Answer Activities of Daily Living (Please select one for each item) Drive Automobile Not Able Take Medications Need Assistance Use Telephone Need Assistance Care for Appearance Need Assistance Use Toilet Need Assistance Bath / Shower Need Assistance Dress Self Need Assistance Feed Self Need Assistance Walk Need Assistance Get In / Out Bed Need Assistance Housework Need Assistance Prepare Meals Not  Able Handle Money Not Able Shop for Self Not Able Electronic Signature(s) Signed: 07/12/2015 3:41:33 PM By: Elpidio Eric BSN, RN Entered By: Elpidio Eric on 07/12/2015 09:57:32 Casey Sanford ([id number]) -------------------------------------------------------------------------------- Education Assessment Details Patient Name: Casey Sanford. Date of Service: 07/12/2015 9:30 AM Medical Record Patient Account Number: [id number] Number: Afful, RN, BSN, Treating RN: 06/05/1923 (82 y.o. [place] Date of Birth/Sex: Female) Other Clinician: Primary Care Physician: TATE, DENNY Treating Evlyn Kanner Referring Physician: Dewaine Oats Physician/Extender: Tania Ade in Treatment: 0 Primary Learner Assessed: Patient Learning Preferences/Education Level/Primary Language Learning Preference: Explanation Highest Education Level: High School Preferred Language: English Cognitive Barrier Assessment/Beliefs Language Barrier: No Physical Barrier Assessment Impaired Vision: Yes Glasses Impaired Hearing: Yes Hearing Aid Decreased Hand dexterity: Yes Knowledge/Comprehension Assessment Knowledge Level: Low Comprehension Level: Low Ability to understand written Low instructions: Ability to understand verbal Low instructions: Motivation Assessment Anxiety Level: Calm Cooperation: Cooperative Education Importance: Acknowledges Need Interest in Health Problems: Asks Questions Perception: Coherent Willingness to Engage in Self- Low Management Activities: Readiness to Engage in Self- Low Management Activities: Electronic Signature(s) Signed: 07/12/2015 3:41:33 PM By: Elpidio Eric BSN, RN Entered By: Elpidio Eric on 07/12/2015 09:56:58 Casey Sanford, Casey Sanford ([name], [name] -------------------------------------------------------------------------------- Fall Risk Assessment Details Patient Name: Casey Sanford. Date of Service: 07/12/2015 9:30 AM Medical Record Patient  Account Number: [id number] Number: Afful, RN, BSN, Treating RN: 01-Jan-1923 (82 y.o. [place] Date of Birth/Sex: Female) Other Clinician: Primary Care Physician: TATE, [facility] Treating Britto, Errol Referring Physician: Dewaine Oats Physician/Extender: Weeks in Treatment: 0 Fall Risk Assessment Items Have you had 2 or more falls in the last 12 monthso 0 No Have you had any fall that resulted in injury in the last 12 monthso 0 No FALL RISK ASSESSMENT: History of falling - immediate or within 3 months 0 No Secondary diagnosis 0 No Ambulatory aid None/bed rest/wheelchair/nurse 0 Yes Crutches/cane/walker 0 No Furniture  0 No IV Access/Saline Lock 0 No Gait/Training Normal/bed rest/immobile 0 Yes Weak 0 No Impaired 0 No Mental Status Oriented to own ability 0 Yes Electronic Signature(s) Signed: 07/12/2015 3:41:33 PM By: Elpidio Eric BSN, RN Entered By: Elpidio Eric on 07/12/2015 09:56:31 Casey Sanford ([id number]) -------------------------------------------------------------------------------- Foot Assessment Details Patient Name: Casey Sanford. Date of Service: 07/12/2015 9:30 AM Medical Record Patient Account Number: [id number] Number: Afful, RN, BSN, Treating RN: 1923/05/20 (82 y.o. [place] Date of Birth/Sex: Female) Other Clinician: Primary Care Physician: TATE, [facility] Treating Britto, Errol Referring Physician: Dewaine Oats Physician/Extender: Weeks in Treatment: 0 Foot Assessment Items Site Locations + = Sensation present, - = Sensation absent, C = Callus, U = Ulcer R = Redness, W = Warmth, M = Maceration, PU = Pre-ulcerative lesion F = Fissure, S = Swelling, D = Dryness Assessment Right: Left: Other Deformity: No No Prior Foot Ulcer: No No Prior Amputation: No No Charcot Joint: No No Ambulatory Status: Ambulatory With Help Assistance Device: Walker Gait: Surveyor, mining) Signed: 07/12/2015 3:41:33 PM By: Elpidio Eric BSN, RN Entered  By: Elpidio Eric on 07/12/2015 09:55:25 Casey Sanford ([id number]) Casey Sanford, Casey Sanford ([id number]) -------------------------------------------------------------------------------- Nutrition Risk Assessment Details Patient Name: Casey Sanford. Date of Service: 07/12/2015 9:30 AM Medical Record Patient Account Number: [id number] Number: Afful, RN, BSN, Treating RN: [birth year] (82 y.o.  [place] Date of Birth/Sex: Female) Other Clinician: Primary Care Physician: TATE, DENNY Treating Evlyn Kanner Referring Physician: TATE, Katherina Right Physician/Extender: Weeks in Treatment: 0 Height (in): Weight (lbs): Body Mass Index (BMI): Nutrition Risk Assessment Items NUTRITION RISK SCREEN: I have an illness or condition that made me change the kind and/or 0 No amount of food I eat I eat fewer than two meals per day 0 No I eat few fruits and vegetables, or milk products 0 No I have three or more drinks of beer, liquor or wine almost every day 0 No I have tooth or mouth problems that make it hard for me to eat 0 No I don't always have enough money to buy the food I need 0 No I eat alone most of the time 0 No I take three or more different prescribed or over-the-counter drugs a 0 No day Without wanting to, I have lost or gained 10 pounds in the last six 0 No months I am not always physically able to shop, cook and/or feed myself 0 No Nutrition Protocols Good Risk Protocol 0 No interventions needed Moderate Risk Protocol Electronic Signature(s) Signed: 07/12/2015 3:41:33 PM By: Elpidio Eric BSN, RN Entered By: Elpidio Eric on 07/12/2015 09:54:58

## 2015-07-19 ENCOUNTER — Encounter: Payer: Medicare Other | Admitting: Surgery

## 2015-07-19 DIAGNOSIS — I70235 Atherosclerosis of native arteries of right leg with ulceration of other part of foot: Secondary | ICD-10-CM | POA: Diagnosis not present

## 2015-07-20 NOTE — Progress Notes (Addendum)
Casey Sanford ([id number]) Visit Report for 07/19/2015 Arrival Information Details Patient Name: Casey Sanford, Casey Sanford. Date of Service: 07/19/2015 9:30 AM Medical Record Number: [id number] Patient Account Number: [id number] Date of Birth/Sex: 1923/02/22 (82 y.o. Female) Treating RN: Curtis Sites Primary Care Physician: Dewaine Oats Other Clinician: Referring Physician: Dewaine Oats Treating Physician/Extender: Rudene Re in Treatment: 1 Visit Information History Since Last Visit Added or deleted any medications: No Patient Arrived: Walker Any new allergies or adverse reactions: No Arrival Time: 09:32 Had a fall or experienced change in No Accompanied By: dtr activities of daily living that may affect Transfer Assistance: None risk of falls: Patient Identification Verified: Yes Signs or symptoms of abuse/neglect since last No Secondary Verification Process Completed: Yes visito Patient Requires Transmission-Based No Hospitalized since last visit: No Precautions: Pain Present Now: No Patient Has Alerts: No Electronic Signature(s) Signed: 07/19/2015 5:07:25 PM By: Curtis Sites Entered By: Curtis Sites on 07/19/2015 09:34:34 Casey Sanford ([id number]) -------------------------------------------------------------------------------- Clinic Level of Care Assessment Details Patient Name: Casey Sanford. Date of Service: 07/19/2015 9:30 AM Medical Record Number: [id number] Patient Account Number: [id number] Date of Birth/Sex: [birth year] (82 y.o. Female) Treating RN: Curtis Sites Primary Care Physician: TATE, Katherina Right Other Clinician: Referring Physician: Dewaine Oats Treating Physician/Extender: Rudene Re in Treatment: 1 Clinic Level of Care Assessment Items TOOL 4 Quantity Score  - Use when only an EandM is performed on FOLLOW-UP visit 0 ASSESSMENTS - Nursing Assessment / Reassessment X - Reassessment of Co-morbidities (includes updates in patient status)  1 10 X - Reassessment of Adherence to Treatment Plan 1 5 ASSESSMENTS - Wound and Skin Assessment / Reassessment X - Simple Wound Assessment / Reassessment - one wound 1 5  - Complex Wound Assessment / Reassessment - multiple wounds 0  - Dermatologic / Skin Assessment (not related to wound area) 0 ASSESSMENTS - Focused Assessment  - Circumferential Edema Measurements - multi extremities 0  - Nutritional Assessment / Counseling / Intervention 0 X - Lower Extremity Assessment (monofilament, tuning fork, pulses) 1 5  - Peripheral Arterial Disease Assessment (using hand held doppler) 0 ASSESSMENTS - Ostomy and/or Continence Assessment and Care  - Incontinence Assessment and Management 0  - Ostomy Care Assessment and Management (repouching, etc.) 0 PROCESS - Coordination of Care X - Simple Patient / Family Education for ongoing care 1 15  - Complex (extensive) Patient / Family Education for ongoing care 0  - Staff obtains Chiropractor, Records, Test Results / Process Orders 0  - Staff telephones HHA, Nursing Homes / Clarify orders / etc 0  - Routine Transfer to another Facility (non-emergent condition) 0 Angerer, Macrina H. ([id number])  - Routine Hospital Admission (non-emergent condition) 0  - New Admissions / Manufacturing engineer / Ordering NPWT, Apligraf, etc. 0  - Emergency Hospital Admission (emergent condition) 0 X - Simple Discharge Coordination 1 10  - Complex (extensive) Discharge Coordination 0 PROCESS - Special Needs  - Pediatric / Minor Patient Management 0  - Isolation Patient Management 0  - Hearing / Language / Visual special needs 0  - Assessment of Community assistance (transportation, D/C planning, etc.) 0  - Additional assistance / Altered mentation 0  - Support Surface(s) Assessment (bed, cushion, seat, etc.) 0 INTERVENTIONS - Wound Cleansing / Measurement X - Simple Wound Cleansing - one wound 1 5  - Complex Wound Cleansing -  multiple wounds 0 X - Wound Imaging (photographs - any number of wounds) 1 5  - Wound Tracing (instead of photographs) 0 X -  Simple Wound Measurement - one wound 1 5  - Complex Wound Measurement - multiple wounds 0 INTERVENTIONS - Wound Dressings X - Small Wound Dressing one or multiple wounds 1 10  - Medium Wound Dressing one or multiple wounds 0  - Large Wound Dressing one or multiple wounds 0  - Application of Medications - topical 0  - Application of Medications - injection 0 INTERVENTIONS - Miscellaneous  - External ear exam 0 Ilagan, Jerriann H. ([id number])  - Specimen Collection (cultures, biopsies, blood, body fluids, etc.) 0  - Specimen(s) / Culture(s) sent or taken to Lab for analysis 0  - Patient Transfer (multiple staff / Michiel Sites Lift / Similar devices) 0  - Simple Staple / Suture removal (25 or less) 0  - Complex Staple / Suture removal (26 or more) 0  - Hypo / Hyperglycemic Management (close monitor of Blood Glucose) 0  - Ankle / Brachial Index (ABI) - do not check if billed separately 0 X - Vital Signs 1 5 Has the patient been seen at the hospital within the last three years: Yes Total Score: 80 Level Of Care: New/Established - Level 3 Electronic Signature(s) Signed: 07/19/2015 5:07:25 PM By: Curtis Sites Entered By: Curtis Sites on 07/19/2015 10:14:09 Casey Sanford ([id number]) -------------------------------------------------------------------------------- Encounter Discharge Information Details Patient Name: Casey Sanford. Date of Service: 07/19/2015 9:30 AM Medical Record Number: [id number] Patient Account Number: [id number] Date of Birth/Sex: September 15, 1922 (82 y.o. Female) Treating RN: Curtis Sites Primary Care Physician: Dewaine Oats Other Clinician: Referring Physician: Dewaine Oats Treating Physician/Extender: Rudene Re in Treatment: 1 Encounter Discharge Information Items Discharge Pain Level: 0 Discharge Condition:  Stable Ambulatory Status: Walker Discharge Destination: Home Transportation: Private Auto Accompanied By: dtr Schedule Follow-up Appointment: Yes Medication Reconciliation completed No and provided to Patient/Care Catelin Manthe: Provided on Clinical Summary of Care: 07/19/2015 Form Type Recipient Paper Patient HI Electronic Signature(s) Signed: 07/19/2015 5:07:25 PM By: Curtis Sites Previous Signature: 07/19/2015 10:00:12 AM Version By: Gwenlyn Perking Entered By: Curtis Sites on 07/19/2015 10:14:55 Casey Sanford ([id number]) -------------------------------------------------------------------------------- Lower Extremity Assessment Details Patient Name: Casey Sanford. Date of Service: 07/19/2015 9:30 AM Medical Record Number: [id number] Patient Account Number: [id number] Date of Birth/Sex: 01/13/1923 (82 y.o. Female) Treating RN: Curtis Sites Primary Care Physician: Dewaine Oats Other Clinician: Referring Physician: Dewaine Oats Treating Physician/Extender: Rudene Re in Treatment: 1 Edema Assessment Assessed: [Left: No] [Right: No] Edema: [Left: N] [Right: o] Vascular Assessment Pulses: Posterior Tibial Dorsalis Pedis Palpable: [Right:Yes] Extremity colors, hair growth, and conditions: Extremity Color: [Right:Pale] Hair Growth on Extremity: [Right:No] Temperature of Extremity: [Right:Warm] Capillary Refill: [Right:< 3 seconds] Toe Nail Assessment Left: Right: Thick: Yes Discolored: Yes Deformed: Yes Improper Length and Hygiene: No Electronic Signature(s) Signed: 07/19/2015 5:07:25 PM By: Curtis Sites Entered By: Curtis Sites on 07/19/2015 09:42:56 Casey Sanford ([id number]) -------------------------------------------------------------------------------- Multi Wound Chart Details Patient Name: Casey Sanford. Date of Service: 07/19/2015 9:30 AM Medical Record Number: [id number] Patient Account Number: [id number] Date of Birth/Sex: [birth year] (82  y.o. Female) Treating RN: Curtis Sites Primary Care Physician: Dewaine Oats Other Clinician: Referring Physician: Dewaine Oats Treating Physician/Extender: Rudene Re in Treatment: 1 Vital Signs Height(in): 64 Pulse(bpm): 71 Weight(lbs): 143 Blood Pressure 154/57 (mmHg): Body Mass Index(BMI): 25 Temperature(F): 98.3 Respiratory Rate 18 (breaths/min): Photos: [1:No Photos] [N/A:N/A] Wound Location: [1:Right Toe Great] [N/A:N/A] Wounding Event: [1:Gradually Appeared] [N/A:N/A] Primary Etiology: [1:To be determined] [N/A:N/A] Comorbid History: [1:Cataracts, Hypertension] [N/A:N/A] Date Acquired: [1:11/23/2014] [N/A:N/A] Weeks of Treatment: [1:1] [N/A:N/A] Wound Status: [1:Open] [N/A:N/A]  Measurements L x W x D 0.2x0.2x0.2 [N/A:N/A] (cm) Area (cm) : [1:0.031] [N/A:N/A] Volume (cm) : [1:0.006] [N/A:N/A] % Reduction in Area: [1:98.80%] [N/A:N/A] % Reduction in Volume: 97.80% [N/A:N/A] Classification: [1:Partial Thickness] [N/A:N/A] Exudate Amount: [1:Small] [N/A:N/A] Exudate Type: [1:Purulent] [N/A:N/A] Exudate Color: [1:yellow, brown, green] [N/A:N/A] Wound Margin: [1:Distinct, outline attached] [N/A:N/A] Granulation Amount: [1:Medium (34-66%)] [N/A:N/A] Necrotic Amount: [1:Small (1-33%)] [N/A:N/A] Exposed Structures: [1:Fascia: No Fat: No Tendon: No Muscle: No Joint: No Bone: No Limited to Skin Breakdown] [N/A:N/A] Epithelialization: [1:None] [N/A:N/A] Periwound Skin Texture: Edema: No N/A N/A Excoriation: No Induration: No Callus: No Crepitus: No Fluctuance: No Friable: No Rash: No Scarring: No Periwound Skin Moist: Yes N/A N/A Moisture: Dry/Scaly: Yes Maceration: No Periwound Skin Color: Atrophie Blanche: No N/A N/A Cyanosis: No Ecchymosis: No Erythema: No Hemosiderin Staining: No Mottled: No Pallor: No Rubor: No Temperature: No Abnormality N/A N/A Tenderness on Yes N/A N/A Palpation: Wound Preparation: Ulcer Cleansing: N/A  N/A Rinsed/Irrigated with Saline Topical Anesthetic Applied: Other: lidocaine 4% Treatment Notes Electronic Signature(s) Signed: 07/19/2015 5:07:25 PM By: Curtis Sites Entered By: Curtis Sites on 07/19/2015 09:52:32 KENLY, HENCKEL ([id number]) -------------------------------------------------------------------------------- Multi-Disciplinary Care Plan Details Patient Name: Casey Sanford. Date of Service: 07/19/2015 9:30 AM Medical Record Number: [id number] Patient Account Number: [id number] Date of Birth/Sex: [birth year] (82 y.o. Female) Treating RN: Curtis Sites Primary Care Physician: Dewaine Oats Other Clinician: Referring Physician: Dewaine Oats Treating Physician/Extender: Rudene Re in Treatment: 1 Active Inactive Electronic Signature(s) Signed: 08/24/2015 1:09:27 PM By: Curtis Sites Previous Signature: 07/19/2015 5:07:25 PM Version By: Curtis Sites Entered By: Curtis Sites on 08/24/2015 13:09:26 Casey Sanford ([id number]) -------------------------------------------------------------------------------- Patient/Caregiver Education Details Patient Name: Casey Sanford Date of Service: 07/19/2015 9:30 AM Medical Record Number: [id number] Patient Account Number: [id number] Date of Birth/Gender: 08/03/1922 (82 y.o. Female) Treating RN: Curtis Sites Primary Care Physician: Dewaine Oats Other Clinician: Referring Physician: Dewaine Oats Treating Physician/Extender: Rudene Re in Treatment: 1 Education Assessment Education Provided To: Patient and Caregiver Education Topics Provided Wound/Skin Impairment: Handouts: Other: wound care to continue as ordered Methods: Demonstration, Explain/Verbal Responses: State content correctly Electronic Signature(s) Signed: 07/19/2015 5:07:25 PM By: Curtis Sites Entered By: Curtis Sites on 07/19/2015 10:15:12 Casey Sanford  ([id number]) -------------------------------------------------------------------------------- Wound Assessment Details Patient Name: Casey Sanford. Date of Service: 07/19/2015 9:30 AM Medical Record Number: [id number] Patient Account Number: [id number] Date of Birth/Sex: 08/12/1922 (82 y.o. Female) Treating RN: Curtis Sites Primary Care Physician: Dewaine Oats Other Clinician: Referring Physician: Dewaine Oats Treating Physician/Extender: Rudene Re in Treatment: 1 Wound Status Wound Number: 1 Primary Etiology: To be determined Wound Location: Right Toe Great Wound Status: Open Wounding Event: Gradually Appeared Comorbid History: Cataracts, Hypertension Date Acquired: 11/23/2014 Weeks Of Treatment: 1 Clustered Wound: No Photos Photo Uploaded By: Curtis Sites on 07/19/2015 16:42:31 Wound Measurements Length: (cm) 0.2 Width: (cm) 0.2 Depth: (cm) 0.2 Area: (cm) 0.031 Volume: (cm) 0.006 % Reduction in Area: 98.8% % Reduction in Volume: 97.8% Epithelialization: None Tunneling: No Undermining: No Wound Description Classification: Partial Thickness Wound Margin: Distinct, outline attached Exudate Amount: Small Exudate Type: Purulent Exudate Color: yellow, brown, green Foul Odor After Cleansing: No Wound Bed Granulation Amount: Medium (34-66%) Exposed Structure Necrotic Amount: Small (1-33%) Fascia Exposed: No Necrotic Quality: Adherent Slough Fat Layer Exposed: No Tendon Exposed: No Heizer, Christana H. ([id number]) Muscle Exposed: No Joint Exposed: No Bone Exposed: No Limited to Skin Breakdown Periwound Skin Texture Texture Color No Abnormalities Noted: No No Abnormalities Noted: No Callus: No Atrophie Blanche: No Crepitus: No  Cyanosis: No Excoriation: No Ecchymosis: No Fluctuance: No Erythema: No Friable: No Hemosiderin Staining: No Induration: No Mottled: No Localized Edema: No Pallor: No Rash: No Rubor: No Scarring: No Temperature /  Pain Moisture Temperature: No Abnormality No Abnormalities Noted: No Tenderness on Palpation: Yes Dry / Scaly: Yes Maceration: No Moist: Yes Wound Preparation Ulcer Cleansing: Rinsed/Irrigated with Saline Topical Anesthetic Applied: Other: lidocaine 4%, Electronic Signature(s) Signed: 07/19/2015 5:07:25 PM By: Curtis Sites Entered By: Curtis Sites on 07/19/2015 09:42:14 Casey Sanford ([id number]) -------------------------------------------------------------------------------- Vitals Details Patient Name: Casey Sanford. Date of Service: 07/19/2015 9:30 AM Medical Record Number: [id number] Patient Account Number: [id number] Date of Birth/Sex: 03-30-1923 (82 y.o. Female) Treating RN: Curtis Sites Primary Care Physician: TATE, Katherina Right Other Clinician: Referring Physician: Dewaine Oats Treating Physician/Extender: Rudene Re in Treatment: 1 Vital Signs Time Taken: 09:34 Temperature (F): 98.3 Height (in): 64 Pulse (bpm): 71 Weight (lbs): 143 Respiratory Rate (breaths/min): 18 Body Mass Index (BMI): 24.5 Blood Pressure (mmHg): 154/57 Reference Range: 80 - 120 mg / dl Electronic Signature(s) Signed: 07/19/2015 5:07:25 PM By: Curtis Sites Entered By: Curtis Sites on 07/19/2015 09:35:24

## 2015-07-20 NOTE — Progress Notes (Signed)
Casey Sanford, Casey Sanford ([id number]) Visit Report for 07/19/2015 Chief Complaint Document Details Patient Name: Casey Sanford, Casey Sanford 07/19/2015 9:30 Date of Service: AM Medical Record [id number] Number: Patient Account Number: [id number] 04-20-1923 (82 y.o. Treating RN: Curtis Sites Date of Birth/Sex: Female) Other Clinician: Primary Care Physician: TATE, DENNY Treating Evlyn Kanner Referring Physician: Dewaine Oats Physician/Extender: Weeks in Treatment: 1 Information Obtained from: Patient Chief Complaint Patient visit today for follow-up of arterial ulceration her right big toe which she's had for about 7 months. Electronic Signature(s) Signed: 07/19/2015 9:54:49 AM By: Evlyn Kanner MD, FACS Entered By: Evlyn Kanner on 07/19/2015 09:54:49 Casey Sanford, Casey Sanford ([id number]) -------------------------------------------------------------------------------- HPI Details Patient Name: Casey Sanford, Casey Sanford 07/19/2015 9:30 Date of Service: AM Medical Record [id number] Number: Patient Account Number: [id number] [birth year] (82 y.o. Treating RN: Curtis Sites Date of Birth/Sex: Female) Other Clinician: Primary Care Physician: TATE, DENNY Treating Evlyn Kanner Referring Physician: Dewaine Oats Physician/Extender: Weeks in Treatment: 1 History of Present Illness Location: right big toe Quality: Patient reports No Pain. Severity: Patient states wound (s) are getting better. Duration: Patient has had the wound for > 7 months prior to seeking treatment at the wound center Context: The wound appeared gradually over time Modifying Factors: Other treatment(s) tried include:removal of her right great toenail because of fungal infection and has recently had a angioplasty of the blood vessels of the right lower extremity. Associated Signs and Symptoms: Patient reports having difficulty standing for long periods. HPI Description: 82 year old patient has been referred to [place] by her podiatrist Dr. Gwyneth Revels  with the right great toe nonhealing wound which has been there for 7 months. The patient has been seeing Dr. Ether Griffins for the same and he has tried multiple treatments including topical antibiotics wound care dressing as well as oral antibiotics. She has also had stenting of her lower extremity with increased circulation. x-ray of the right foot done on 05/25/2015 shows no acute fracture, and no erosive changes or space- occupying lesions on the right great toe. On review of her electronic records with Epic, I note that she was diagnosed with atherosclerotic occlusive disease bilateral lower extremities with ulceration of the right foot and was taken up for surgery by Dr. Gilda Crease on 06/09/2015. he did a angioplasty of the right popliteal, right peroneal and right anterior tibial. past medical history significant for paronychia right great toe, hyperlipidemia and hypertension and has had no surgery in the past. She has never been a smoker. recent notes were reviewed from the vascular office on 07/08/2015 she had a ABI done which showed the right lower extremity is 1.0 and the left lower extremity is 0.89. He recommended no further tests or studies and regimen recommended graduated compression stockings of the 10-15 mm stent to control mild edema. 07/19/2015 -- her podiatrist has referred her to Dr. Sampson Goon for a MRSA infection of her toe and the patient has been on Bactrim for the last 5 days. Electronic Signature(s) Signed: 07/19/2015 9:55:15 AM By: Evlyn Kanner MD, FACS Entered By: Evlyn Kanner on 07/19/2015 09:55:15 Casey Sanford, Casey Sanford ([id number]) -------------------------------------------------------------------------------- Physical Exam Details Patient Name: Casey Sanford, Casey Sanford 07/19/2015 9:30 Date of Service: AM Medical Record [id number] Number: Patient Account Number: [id number] Nov 15, 1922 (82 y.o. Treating RN: Curtis Sites Date of Birth/Sex: Female) Other Clinician: Primary Care  Physician: TATE, DENNY Treating Evlyn Kanner Referring Physician: TATE, Katherina Right Physician/Extender: Weeks in Treatment: 1 Constitutional . Pulse regular. Respirations normal and unlabored. Afebrile. . Eyes Nonicteric. Reactive to light. Ears, Nose, Mouth, and Throat Lips, teeth, and  gums WNL.Marland Kitchen Moist mucosa without lesions. Neck supple and nontender. No palpable supraclavicular or cervical adenopathy. Normal sized without goiter. Respiratory WNL. No retractions.. Cardiovascular Pedal Pulses WNL. No clubbing, cyanosis or edema. Lymphatic No adneopathy. No adenopathy. No adenopathy. Musculoskeletal Adexa without tenderness or enlargement.. Digits and nails w/o clubbing, cyanosis, infection, petechiae, ischemia, or inflammatory conditions.. Integumentary (Hair, Skin) No suspicious lesions. No crepitus or fluctuance. No peri-wound warmth or erythema. No masses.Marland Kitchen Psychiatric Judgement and insight Intact.. No evidence of depression, anxiety, or agitation.. Notes besides the fungal infection of the nail there is no evidence of any purulent material and the small area which is ulcerated does not probe down to bone and there is no evidence of significant cellulitis or infection Electronic Signature(s) Signed: 07/19/2015 9:56:06 AM By: Evlyn Kanner MD, FACS Entered By: Evlyn Kanner on 07/19/2015 09:56:06 Casey Sanford ([id number]) -------------------------------------------------------------------------------- Physician Orders Details Patient Name: Casey Sanford 07/19/2015 9:30 Date of Service: AM Medical Record [id number] Number: Patient Account Number: [id number] 1922-08-19 (82 y.o. Treating RN: Curtis Sites Date of Birth/Sex: Female) Other Clinician: Primary Care Physician: TATE, DENNY Treating Evlyn Kanner Referring Physician: Dewaine Oats Physician/Extender: Tania Ade in Treatment: 1 Verbal / Phone Orders: Yes Clinician: Curtis Sites Read Back and Verified:  Yes Diagnosis Coding Wound Cleansing Wound #1 Right Toe Great o Clean wound with Normal Saline. Anesthetic Wound #1 Right Toe Great o Topical Lidocaine 4% cream applied to wound bed prior to debridement Primary Wound Dressing Wound #1 Right Toe Great o Aquacel Ag Secondary Dressing Wound #1 Right Toe Great o Gauze and Kerlix/Conform Dressing Change Frequency Wound #1 Right Toe Great o Change dressing every other day. Follow-up Appointments Wound #1 Right Toe Great o Return Appointment in 1 week. Electronic Signature(s) Signed: 07/19/2015 4:26:31 PM By: Evlyn Kanner MD, FACS Signed: 07/19/2015 5:07:25 PM By: Curtis Sites Entered By: Curtis Sites on 07/19/2015 09:52:56 Casey Sanford, Casey Sanford ([id number]) -------------------------------------------------------------------------------- Problem List Details Patient Name: Casey Sanford, Casey Sanford 07/19/2015 9:30 Date of Service: AM Medical Record [id number] Number: Patient Account Number: [id number] [birth year] (82 y.o. Treating RN: Curtis Sites Date of Birth/Sex: Female) Other Clinician: Primary Care Physician: TATE, Katherina Right Treating Evlyn Kanner Referring Physician: Dewaine Oats Physician/Extender: Weeks in Treatment: 1 Active Problems ICD-10 Encounter Code Description Active Date Diagnosis I70.235 Atherosclerosis of native arteries of right leg with 07/12/2015 Yes ulceration of other part of foot L97.211 Non-pressure chronic ulcer of right calf limited to 07/12/2015 Yes breakdown of skin B35.9 Dermatophytosis, unspecified 07/12/2015 Yes Inactive Problems Resolved Problems Electronic Signature(s) Signed: 07/19/2015 9:54:43 AM By: Evlyn Kanner MD, FACS Entered By: Evlyn Kanner on 07/19/2015 09:54:43 Casey Sanford ([id number]) -------------------------------------------------------------------------------- Progress Note Details Patient Name: Casey Sanford 07/19/2015 9:30 Date of Service: AM Medical  Record [id number] Number: Patient Account Number: [id number] 09/01/1922 (82 y.o. Treating RN: Curtis Sites Date of Birth/Sex: Female) Other Clinician: Primary Care Physician: TATE, DENNY Treating Evlyn Kanner Referring Physician: Dewaine Oats Physician/Extender: Weeks in Treatment: 1 Subjective Chief Complaint Information obtained from Patient Patient visit today for follow-up of arterial ulceration her right big toe which she's had for about 7 months. History of Present Illness (HPI) The following HPI elements were documented for the patient's wound: Location: right big toe Quality: Patient reports No Pain. Severity: Patient states wound (s) are getting better. Duration: Patient has had the wound for > 7 months prior to seeking treatment at the wound center Context: The wound appeared gradually over time Modifying Factors: Other treatment(s) tried include:removal of her right great toenail because of  fungal infection and has recently had a angioplasty of the blood vessels of the right lower extremity. Associated Signs and Symptoms: Patient reports having difficulty standing for long periods. 82 year old patient has been referred to [place] by her podiatrist Dr. Gwyneth Revels with the right great toe nonhealing wound which has been there for 7 months. The patient has been seeing Dr. Ether Griffins for the same and he has tried multiple treatments including topical antibiotics wound care dressing as well as oral antibiotics. She has also had stenting of her lower extremity with increased circulation. x-ray of the right foot done on 05/25/2015 shows no acute fracture, and no erosive changes or space- occupying lesions on the right great toe. On review of her electronic records with Epic, I note that she was diagnosed with atherosclerotic occlusive disease bilateral lower extremities with ulceration of the right foot and was taken up for surgery by Dr. Gilda Crease on 06/09/2015. he did a angioplasty of  the right popliteal, right peroneal and right anterior tibial. past medical history significant for paronychia right great toe, hyperlipidemia and hypertension and has had no surgery in the past. She has never been a smoker. recent notes were reviewed from the vascular office on 07/08/2015 she had a ABI done which showed the right lower extremity is 1.0 and the left lower extremity is 0.89. He recommended no further tests or studies and regimen recommended graduated compression stockings of the 10-15 mm stent to control mild edema. 07/19/2015 -- her podiatrist has referred her to Dr. Sampson Goon for a MRSA infection of her toe and the patient has been on Bactrim for the last 5 days. Casey Sanford, Casey Sanford ([id number]) Objective Constitutional Pulse regular. Respirations normal and unlabored. Afebrile. Vitals Time Taken: 9:34 AM, Height: 64 in, Weight: 143 lbs, BMI: 24.5, Temperature: 98.3 F, Pulse: 71 bpm, Respiratory Rate: 18 breaths/min, Blood Pressure: 154/57 mmHg. Eyes Nonicteric. Reactive to light. Ears, Nose, Mouth, and Throat Lips, teeth, and gums WNL.Marland Kitchen Moist mucosa without lesions. Neck supple and nontender. No palpable supraclavicular or cervical adenopathy. Normal sized without goiter. Respiratory WNL. No retractions.. Cardiovascular Pedal Pulses WNL. No clubbing, cyanosis or edema. Lymphatic No adneopathy. No adenopathy. No adenopathy. Musculoskeletal Adexa without tenderness or enlargement.. Digits and nails w/o clubbing, cyanosis, infection, petechiae, ischemia, or inflammatory conditions.Marland Kitchen Psychiatric Judgement and insight Intact.. No evidence of depression, anxiety, or agitation.. General Notes: besides the fungal infection of the nail there is no evidence of any purulent material and the small area which is ulcerated does not probe down to bone and there is no evidence of significant cellulitis or infection Integumentary (Hair, Skin) No suspicious lesions. No crepitus or  fluctuance. No peri-wound warmth or erythema. No masses.. Wound #1 status is Open. Original cause of wound was Gradually Appeared. The wound is located on the Right Toe Great. The wound measures 0.2cm length x 0.2cm width x 0.2cm depth; 0.031cm^2 area and 0.006cm^3 volume. The wound is limited to skin breakdown. There is no tunneling or undermining noted. There is a small amount of purulent drainage noted. The wound margin is distinct with the outline attached to the wound base. There is medium (34-66%) granulation within the wound bed. There is a small (1-33%) Casey Sanford, Casey H. ([id number]) amount of necrotic tissue within the wound bed including Adherent Slough. The periwound skin appearance exhibited: Dry/Scaly, Moist. The periwound skin appearance did not exhibit: Callus, Crepitus, Excoriation, Fluctuance, Friable, Induration, Localized Edema, Rash, Scarring, Maceration, Atrophie Blanche, Cyanosis, Ecchymosis, Hemosiderin Staining, Mottled, Pallor, Rubor, Erythema. Periwound temperature  was noted as No Abnormality. The periwound has tenderness on palpation. Assessment Active Problems ICD-10 I70.235 - Atherosclerosis of native arteries of right leg with ulceration of other part of foot L97.211 - Non-pressure chronic ulcer of right calf limited to breakdown of skin B35.9 - Dermatophytosis, unspecified Plan Wound Cleansing: Wound #1 Right Toe Great: Clean wound with Normal Saline. Anesthetic: Wound #1 Right Toe Great: Topical Lidocaine 4% cream applied to wound bed prior to debridement Primary Wound Dressing: Wound #1 Right Toe Great: Aquacel Ag Secondary Dressing: Wound #1 Right Toe Great: Gauze and Kerlix/Conform Dressing Change Frequency: Wound #1 Right Toe Great: Change dressing every other day. Follow-up Appointments: Wound #1 Right Toe Great: Return Appointment in 1 week. Casey Sanford, Casey Sanford ([id number]) I have recommended silver alginate dressing and appropriate footwear to  offload. Local care has been discussed with her daughter was at the bedside and the patient will see as back next week. Electronic Signature(s) Signed: 07/19/2015 9:56:33 AM By: Evlyn Kanner MD, FACS Entered By: Evlyn Kanner on 07/19/2015 09:56:33 Casey Sanford ([id number]) -------------------------------------------------------------------------------- SuperBill Details Patient Name: Casey Sanford. Date of Service: 07/19/2015 Medical Record Number: [id number] Patient Account Number: [id number] Date of Birth/Sex: May 18, 1923 (82 y.o. Female) Treating RN: Curtis Sites Primary Care Physician: TATE, Katherina Right Other Clinician: Referring Physician: Dewaine Oats Treating Physician/Extender: Rudene Re in Treatment: 1 Diagnosis Coding ICD-10 Codes Code Description I70.235 Atherosclerosis of native arteries of right leg with ulceration of other part of foot L97.211 Non-pressure chronic ulcer of right calf limited to breakdown of skin B35.9 Dermatophytosis, unspecified Facility Procedures CPT4 Code: 08657846 Description: 99213 - WOUND CARE VISIT-LEV 3 EST PT Modifier: Quantity: 1 Physician Procedures CPT4: Description Modifier Quantity Code [id number] 99213 - WC PHYS LEVEL 3 - EST PT 1 ICD-10 Description Diagnosis I70.235 Atherosclerosis of native arteries of right leg with ulceration of other part of foot L97.211 Non-pressure chronic ulcer of right  calf limited to breakdown of skin B35.9 Dermatophytosis, unspecified Electronic Signature(s) Signed: 07/19/2015 4:26:31 PM By: Evlyn Kanner MD, FACS Signed: 07/19/2015 5:07:25 PM By: Curtis Sites Previous Signature: 07/19/2015 9:56:42 AM Version By: Evlyn Kanner MD, FACS Entered By: Curtis Sites on 07/19/2015 10:14:19

## 2015-07-26 ENCOUNTER — Ambulatory Visit: Payer: Self-pay | Admitting: Surgery

## 2016-05-29 ENCOUNTER — Encounter (INDEPENDENT_AMBULATORY_CARE_PROVIDER_SITE_OTHER): Payer: Self-pay

## 2016-05-29 ENCOUNTER — Ambulatory Visit (INDEPENDENT_AMBULATORY_CARE_PROVIDER_SITE_OTHER): Payer: Self-pay | Admitting: Vascular Surgery

## 2016-06-15 ENCOUNTER — Ambulatory Visit (INDEPENDENT_AMBULATORY_CARE_PROVIDER_SITE_OTHER): Payer: Self-pay | Admitting: Vascular Surgery

## 2016-06-15 ENCOUNTER — Encounter (INDEPENDENT_AMBULATORY_CARE_PROVIDER_SITE_OTHER): Payer: Medicare Other

## 2016-06-29 ENCOUNTER — Ambulatory Visit (INDEPENDENT_AMBULATORY_CARE_PROVIDER_SITE_OTHER): Payer: Medicare Other

## 2016-06-29 ENCOUNTER — Other Ambulatory Visit (INDEPENDENT_AMBULATORY_CARE_PROVIDER_SITE_OTHER): Payer: Self-pay | Admitting: Vascular Surgery

## 2016-06-29 ENCOUNTER — Encounter (INDEPENDENT_AMBULATORY_CARE_PROVIDER_SITE_OTHER): Payer: Self-pay | Admitting: Vascular Surgery

## 2016-06-29 ENCOUNTER — Ambulatory Visit (INDEPENDENT_AMBULATORY_CARE_PROVIDER_SITE_OTHER): Payer: Medicare Other | Admitting: Vascular Surgery

## 2016-06-29 DIAGNOSIS — E039 Hypothyroidism, unspecified: Secondary | ICD-10-CM | POA: Diagnosis not present

## 2016-06-29 DIAGNOSIS — I70238 Atherosclerosis of native arteries of right leg with ulceration of other part of lower right leg: Secondary | ICD-10-CM

## 2016-06-29 DIAGNOSIS — Z48812 Encounter for surgical aftercare following surgery on the circulatory system: Secondary | ICD-10-CM

## 2016-06-29 DIAGNOSIS — I70235 Atherosclerosis of native arteries of right leg with ulceration of other part of foot: Secondary | ICD-10-CM

## 2016-06-29 DIAGNOSIS — I10 Essential (primary) hypertension: Secondary | ICD-10-CM

## 2016-06-29 DIAGNOSIS — L97511 Non-pressure chronic ulcer of other part of right foot limited to breakdown of skin: Secondary | ICD-10-CM

## 2016-06-29 DIAGNOSIS — I7025 Atherosclerosis of native arteries of other extremities with ulceration: Secondary | ICD-10-CM | POA: Diagnosis not present

## 2016-06-29 DIAGNOSIS — L97519 Non-pressure chronic ulcer of other part of right foot with unspecified severity: Secondary | ICD-10-CM | POA: Insufficient documentation

## 2016-06-29 NOTE — Progress Notes (Signed)
MRN : [id number]  Casey Sanford is a 83 y.o. (25-Sep-1922) female who presents with chief complaint of  Chief Complaint  Patient presents with  . Follow-up  .  History of Present Illness: The patient returns to the office for followup and review of the noninvasive studies. There have been no interval changes in lower extremity symptoms. No interval shortening of the patient's claudication distance or development of rest pain symptoms. No new ulcers or wounds have occurred since the last visit.  The area of the right great toe under the toenail continues to periodically drain a purulent material. She continues to follow with Dr. Ether Griffins every 6-8 weeks.  There have been no significant changes to the patient's overall health care.  The patient denies amaurosis fugax or recent TIA symptoms. There are no recent neurological changes noted. The patient denies history of DVT, PE or superficial thrombophlebitis. The patient denies recent episodes of angina or shortness of breath.  ABIs Rt=0.56 and Lt=0.70 Duplex ultrasound right lower extremity arterial study demonstrates elevated velocities greater than 300 cm/s in the distal popliteal  Current Meds  Medication Sig  . docusate sodium (COLACE) 100 MG capsule Take 100 mg by mouth every other day.   . furosemide (LASIX) 20 MG tablet Take 1 tablet by mouth daily.  Marland Kitchen levothyroxine (SYNTHROID, LEVOTHROID) 50 MCG tablet Take 1 tablet by mouth daily.  Marland Kitchen losartan (COZAAR) 50 MG tablet Take 1 tablet by mouth daily.  Marland Kitchen Propylene Glycol (SYSTANE BALANCE) 0.6 % SOLN Apply 1 drop to eye 3 (three) times daily.  . risperiDONE (RISPERDAL) 0.5 MG tablet Take 1 tablet by mouth at bedtime.     Past Medical History:  Diagnosis Date  . Dementia   . Hypertension   . Thyroid disease     Past Surgical History:  Procedure Laterality Date  . CHOLECYSTECTOMY    . PERIPHERAL VASCULAR CATHETERIZATION Right 06/09/2015   Procedure: Lower Extremity Angiography;   Surgeon: Renford Dills, MD;  Location: [facility] INVASIVE CV LAB;  Service: Cardiovascular;  Laterality: Right;  . PERIPHERAL VASCULAR CATHETERIZATION Right 06/09/2015   Procedure: Lower Extremity Intervention;  Surgeon: Renford Dills, MD;  Location: [facility] INVASIVE CV LAB;  Service: Cardiovascular;  Laterality: Right;    Social History Social History  Substance Use Topics  . Smoking status: Never Smoker  . Smokeless tobacco: Not on file  . Alcohol use 0.6 oz/week    1 Glasses of wine per week     Comment: nightly    Family History No family history on file. No family history of bleeding/clotting disorders, porphyria or autoimmune disease  Allergies  Allergen Reactions  . Sulfamethoxazole-Trimethoprim Rash  . Cephalexin Itching     REVIEW OF SYSTEMS (Negative unless checked)  Constitutional: [] Weight loss  [] Fever  [] Chills Cardiac: [] Chest pain   [] Chest pressure   [] Palpitations   [] Shortness of breath when laying flat   [] Shortness of breath with exertion. Vascular:  [x] Pain in legs with walking   [] Pain in legs at rest  [] History of DVT   [] Phlebitis   [] Swelling in legs   [] Varicose veins   [x] Non-healing ulcers Pulmonary:   [] Uses home oxygen   [] Productive cough   [] Hemoptysis   [] Wheeze  [] COPD   [] Asthma Neurologic:  [] Dizziness   [] Seizures   [] History of stroke   [] History of TIA  [] Aphasia   [] Vissual changes   [] Weakness or numbness in arm   [] Weakness or numbness in leg Musculoskeletal:   []   Joint swelling   [x] Joint pain   [] Low back pain Hematologic:  [] Easy bruising  [] Easy bleeding   [] Hypercoagulable state   [] Anemic Gastrointestinal:  [] Diarrhea   [] Vomiting  [] Gastroesophageal reflux/heartburn   [] Difficulty swallowing. Genitourinary:  [] Chronic kidney disease   [] Difficult urination  [] Frequent urination   [] Blood in urine Skin:  [] Rashes   [x] Ulcers  Psychological:  [] History of anxiety   []  History of major depression.  Physical Examination  Vitals:     06/29/16 1119  BP: (!) 189/88  Pulse: 88  Resp: 16  Weight: 151 lb 9.6 oz (68.8 kg)  Height: 5\' 4"  (1.626 m)   Body mass index is 26.02 kg/m. Gen: WD/WN, NAD Head: [place]/AT, No temporalis wasting.  Ear/Nose/Throat: Hearing grossly intact, nares w/o erythema or drainage, poor dentition Eyes: PER, EOMI, sclera nonicteric.  Neck: Supple, no masses.  No bruit or JVD.  Pulmonary:  Good air movement, clear to auscultation bilaterally, no use of accessory muscles.  Cardiac: RRR, normal S1, S2, no Murmurs. Vascular: chronic ulcer right great toe mild cyanosis of the plantar surface of both feet Vessel Right Left  Radial Palpable Palpable  Ulnar Palpable Palpable  Brachial Palpable Palpable  Carotid Palpable Palpable  Femoral Palpable Palpable  Popliteal 1+Palpable Not Palpable  PT Not Palpable  Not  Palpable  DP notPalpable Not Palpable   Gastrointestinal: soft, non-distended. No guarding/no peritoneal signs.  Musculoskeletal: M/S 5/5 throughout.  No deformity or atrophy.  Neurologic: CN 2-12 intact. Pain and light touch intact in extremities.  Symmetrical.  Speech is fluent. Motor exam as listed above. Psychiatric: Judgment intact, Mood & affect appropriate for pt's clinical situation. Dermatologic: No rashes  noted.  No changes consistent with cellulitis. Lymph : No Cervical lymphadenopathy, no lichenification or skin changes of chronic lymphedema.  CBC Lab Results  Component Value Date   WBC 9.6 02/05/2015   HGB 13.5 02/05/2015   HCT 40.5 02/05/2015   MCV 99.3 02/05/2015   PLT 241 02/05/2015    BMET    Component Value Date/Time   NA 135 02/05/2015 2149   K 3.4 (L) 02/05/2015 2149   CL 99 (L) 02/05/2015 2149   CO2 27 02/05/2015 2149   GLUCOSE 111 (H) 02/05/2015 2149   BUN 16 06/07/2015 1503   CREATININE 0.90 06/07/2015 1503   CALCIUM 9.2 02/05/2015 2149   GFRNONAA 55 (L) 06/07/2015 1503   GFRAA >60 06/07/2015 1503   CrCl cannot be calculated (Patient's most recent  lab result is older than the maximum 21 days allowed.).  COAG No results found for: INR, PROTIME  Radiology No results found.   Assessment/Plan 1. Atherosclerosis of native arteries of the extremities with ulceration (HCC)  Recommend:  The patient has evidence of atherosclerosis of the lower extremities with claudication.  The patient does not voice lifestyle limiting changes at this point in time.  The ulceration she has is chronic does not appear to be having a tremendous negative impact on her daily activities and is not causing her any pain. I again discussed the option of angiography and intervention as was done December 2016 but at this time the patient wishes to continue with conservative therapy. It was stressed with the patient that if a change should occur with increasing pain foul odor or increasing redness and swelling that she should contact me right away. Patient and daughter voiced understanding.  Noninvasive studies do not suggest clinically significant change.  No invasive studies, angiography or surgery at this time The patient  should continue walking and begin a more formal exercise program.  The patient should continue antiplatelet therapy and aggressive treatment of the lipid abnormalities  No changes in the patient's medications at this time  The patient should continue wearing graduated compression socks 10-15 mmHg strength to control the mild edema.   - VAS US ABI WITH/WO TBI; Future - VAS US LOWER EXTREMITY ARTERIAL DUPLEX; Future  2. Chronic ulcer of great toe of right foot, limited to breakdown of skin (HCC) Seen #1  3. Essential hypertension Continue antihypertensive medications as already ordered, these medications have been reviewed and there are no changes at this time.   4. Hypothyroidism, unspecified type Continue Synthroid as already ordered, this medication has been reviewed and there are no changes at this time.   Levora DredgeGregory Jibri Schriefer,  MD  06/29/2016 11:33 AM

## 2016-07-21 ENCOUNTER — Emergency Department: Payer: Medicare Other

## 2016-07-21 ENCOUNTER — Inpatient Hospital Stay
Admission: EM | Admit: 2016-07-21 | Discharge: 2016-07-23 | DRG: 065 | Disposition: A | Discharge status: Home / Self Care | Payer: Medicare Other | Attending: Internal Medicine | Admitting: Internal Medicine

## 2016-07-21 ENCOUNTER — Encounter: Payer: Self-pay | Admitting: Emergency Medicine

## 2016-07-21 ENCOUNTER — Inpatient Hospital Stay: Payer: Medicare Other

## 2016-07-21 DIAGNOSIS — I70235 Atherosclerosis of native arteries of right leg with ulceration of other part of foot: Secondary | ICD-10-CM | POA: Diagnosis present

## 2016-07-21 DIAGNOSIS — E785 Hyperlipidemia, unspecified: Secondary | ICD-10-CM | POA: Diagnosis present

## 2016-07-21 DIAGNOSIS — Z66 Do not resuscitate: Secondary | ICD-10-CM | POA: Diagnosis present

## 2016-07-21 DIAGNOSIS — I63412 Cerebral infarction due to embolism of left middle cerebral artery: Secondary | ICD-10-CM | POA: Diagnosis not present

## 2016-07-21 DIAGNOSIS — I10 Essential (primary) hypertension: Secondary | ICD-10-CM | POA: Diagnosis present

## 2016-07-21 DIAGNOSIS — E039 Hypothyroidism, unspecified: Secondary | ICD-10-CM | POA: Diagnosis present

## 2016-07-21 DIAGNOSIS — L97519 Non-pressure chronic ulcer of other part of right foot with unspecified severity: Secondary | ICD-10-CM | POA: Diagnosis present

## 2016-07-21 DIAGNOSIS — R4701 Aphasia: Secondary | ICD-10-CM | POA: Diagnosis present

## 2016-07-21 DIAGNOSIS — E079 Disorder of thyroid, unspecified: Secondary | ICD-10-CM | POA: Diagnosis present

## 2016-07-21 DIAGNOSIS — Z888 Allergy status to other drugs, medicaments and biological substances status: Secondary | ICD-10-CM | POA: Diagnosis not present

## 2016-07-21 DIAGNOSIS — G8191 Hemiplegia, unspecified affecting right dominant side: Secondary | ICD-10-CM | POA: Diagnosis present

## 2016-07-21 DIAGNOSIS — I482 Chronic atrial fibrillation: Secondary | ICD-10-CM | POA: Diagnosis present

## 2016-07-21 DIAGNOSIS — I63532 Cerebral infarction due to unspecified occlusion or stenosis of left posterior cerebral artery: Principal | ICD-10-CM | POA: Diagnosis present

## 2016-07-21 DIAGNOSIS — Z7901 Long term (current) use of anticoagulants: Secondary | ICD-10-CM | POA: Diagnosis not present

## 2016-07-21 DIAGNOSIS — I639 Cerebral infarction, unspecified: Secondary | ICD-10-CM | POA: Diagnosis not present

## 2016-07-21 DIAGNOSIS — R531 Weakness: Secondary | ICD-10-CM

## 2016-07-21 DIAGNOSIS — G459 Transient cerebral ischemic attack, unspecified: Secondary | ICD-10-CM

## 2016-07-21 DIAGNOSIS — I4891 Unspecified atrial fibrillation: Secondary | ICD-10-CM

## 2016-07-21 DIAGNOSIS — Z882 Allergy status to sulfonamides status: Secondary | ICD-10-CM | POA: Diagnosis not present

## 2016-07-21 DIAGNOSIS — I6521 Occlusion and stenosis of right carotid artery: Secondary | ICD-10-CM

## 2016-07-21 DIAGNOSIS — M6281 Muscle weakness (generalized): Secondary | ICD-10-CM

## 2016-07-21 HISTORY — DX: Unspecified atrial fibrillation: I48.91

## 2016-07-21 HISTORY — DX: Transient cerebral ischemic attack, unspecified: G45.9

## 2016-07-21 LAB — CBC WITH DIFFERENTIAL/PLATELET
BASOS ABS: 0 10*3/uL (ref 0–0.1)
Basophils Relative: 0 %
EOS ABS: 0.7 10*3/uL (ref 0–0.7)
Eosinophils Relative: 7 %
HCT: 37.5 % (ref 35.0–47.0)
HEMOGLOBIN: 13.2 g/dL (ref 12.0–16.0)
LYMPHS ABS: 1.8 10*3/uL (ref 1.0–3.6)
Lymphocytes Relative: 20 %
MCH: 34.8 pg — AB (ref 26.0–34.0)
MCHC: 35.2 g/dL (ref 32.0–36.0)
MCV: 98.8 fL (ref 80.0–100.0)
Monocytes Absolute: 0.8 10*3/uL (ref 0.2–0.9)
Monocytes Relative: 9 %
Neutro Abs: 5.8 10*3/uL (ref 1.4–6.5)
Neutrophils Relative %: 64 %
Platelets: 229 10*3/uL (ref 150–440)
RBC: 3.79 MIL/uL — AB (ref 3.80–5.20)
RDW: 14.9 % — ABNORMAL HIGH (ref 11.5–14.5)
WBC: 9.1 10*3/uL (ref 3.6–11.0)

## 2016-07-21 LAB — COMPREHENSIVE METABOLIC PANEL
ALBUMIN: 3.6 g/dL (ref 3.5–5.0)
ALT: 16 U/L (ref 14–54)
ANION GAP: 5 (ref 5–15)
AST: 22 U/L (ref 15–41)
Alkaline Phosphatase: 85 U/L (ref 38–126)
BILIRUBIN TOTAL: 0.7 mg/dL (ref 0.3–1.2)
BUN: 16 mg/dL (ref 6–20)
CHLORIDE: 104 mmol/L (ref 101–111)
CO2: 26 mmol/L (ref 22–32)
Calcium: 9.1 mg/dL (ref 8.9–10.3)
Creatinine, Ser: 0.9 mg/dL (ref 0.44–1.00)
GFR calc Af Amer: >60 mL/min (ref >60)
GFR, EST NON AFRICAN AMERICAN: 54 mL/min — AB (ref >60)
Glucose, Bld: 116 mg/dL — ABNORMAL HIGH (ref 65–99)
Potassium: 4.3 mmol/L (ref 3.5–5.1)
SODIUM: 135 mmol/L (ref 135–145)
TOTAL PROTEIN: 6.7 g/dL (ref 6.5–8.1)

## 2016-07-21 LAB — MRSA PCR SCREENING: MRSA by PCR: NEGATIVE

## 2016-07-21 LAB — TROPONIN I

## 2016-07-21 LAB — GLUCOSE, CAPILLARY: Glucose-Capillary: 110 mg/dL — ABNORMAL HIGH (ref 65–99)

## 2016-07-21 MED ORDER — LEVOTHYROXINE SODIUM 50 MCG PO TABS
50.0000 ug | ORAL_TABLET | Freq: Every day | ORAL | Status: DC
  Administered 2016-07-22 – 2016-07-23 (×2): 50 ug via ORAL
  Filled 2016-07-21 (×2): qty 1

## 2016-07-21 MED ORDER — MELOXICAM 7.5 MG PO TABS
7.5000 mg | ORAL_TABLET | Freq: Every day | ORAL | Status: DC
  Administered 2016-07-21 – 2016-07-23 (×3): 7.5 mg via ORAL
  Filled 2016-07-21 (×3): qty 1

## 2016-07-21 MED ORDER — SENNOSIDES-DOCUSATE SODIUM 8.6-50 MG PO TABS: 1.0000 | ORAL_TABLET | Freq: Every evening | ORAL | Status: DC | PRN

## 2016-07-21 MED ORDER — LOSARTAN POTASSIUM 50 MG PO TABS
50.0000 mg | ORAL_TABLET | Freq: Every day | ORAL | Status: DC
  Administered 2016-07-21 – 2016-07-23 (×3): 50 mg via ORAL
  Filled 2016-07-21 (×3): qty 1

## 2016-07-21 MED ORDER — ATORVASTATIN CALCIUM 20 MG PO TABS
40.0000 mg | ORAL_TABLET | Freq: Every day | ORAL | Status: DC
  Administered 2016-07-21 – 2016-07-22 (×2): 40 mg via ORAL
  Filled 2016-07-21 (×2): qty 2

## 2016-07-21 MED ORDER — APIXABAN 5 MG PO TABS
5.0000 mg | ORAL_TABLET | Freq: Two times a day (BID) | ORAL | Status: DC
  Administered 2016-07-21 – 2016-07-23 (×4): 5 mg via ORAL
  Filled 2016-07-21 (×4): qty 1

## 2016-07-21 MED ORDER — GUAIFENESIN 100 MG/5ML PO SYRP
100.0000 mg | ORAL_SOLUTION | Freq: Four times a day (QID) | ORAL | Status: DC | PRN
  Filled 2016-07-21: qty 5

## 2016-07-21 MED ORDER — ACETAMINOPHEN 325 MG PO TABS: 650.0000 mg | ORAL_TABLET | ORAL | Status: DC | PRN

## 2016-07-21 MED ORDER — FUROSEMIDE 20 MG PO TABS
20.0000 mg | ORAL_TABLET | Freq: Every day | ORAL | Status: DC
  Administered 2016-07-21 – 2016-07-23 (×3): 20 mg via ORAL
  Filled 2016-07-21 (×3): qty 1

## 2016-07-21 MED ORDER — POLYVINYL ALCOHOL 1.4 % OP SOLN
1.0000 [drp] | Freq: Three times a day (TID) | OPHTHALMIC | Status: DC
  Administered 2016-07-22 – 2016-07-23 (×3): 1 [drp] via OPHTHALMIC
  Filled 2016-07-21: qty 15

## 2016-07-21 MED ORDER — STROKE: EARLY STAGES OF RECOVERY BOOK
Freq: Once | Status: AC
  Administered 2016-07-21: 20:00:00

## 2016-07-21 MED ORDER — RISPERIDONE 0.5 MG PO TABS
0.5000 mg | ORAL_TABLET | Freq: Every day | ORAL | Status: DC
  Administered 2016-07-21 – 2016-07-22 (×2): 0.5 mg via ORAL
  Filled 2016-07-21 (×2): qty 1

## 2016-07-21 MED ORDER — ASPIRIN 81 MG PO CHEW
81.0000 mg | CHEWABLE_TABLET | Freq: Once | ORAL | Status: AC
  Administered 2016-07-21: 81 mg via ORAL
  Filled 2016-07-21: qty 1

## 2016-07-21 MED ORDER — ACETAMINOPHEN 650 MG RE SUPP: 650.0000 mg | RECTAL | Status: DC | PRN

## 2016-07-21 MED ORDER — ACETAMINOPHEN 160 MG/5ML PO SOLN: 650.0000 mg | ORAL | Status: DC | PRN

## 2016-07-21 MED ORDER — ASPIRIN 81 MG PO CHEW
81.0000 mg | CHEWABLE_TABLET | Freq: Every day | ORAL | Status: DC
  Administered 2016-07-21 – 2016-07-23 (×3): 81 mg via ORAL
  Filled 2016-07-21 (×3): qty 1

## 2016-07-21 NOTE — ED Triage Notes (Signed)
Pt arrived via EMS from [facility] for reports of episode of slurred speech this morning at 0730. Pt was at baseline upon rising this morning. Staff checked on her at 0730, pt stated help and had slurred speech. EMS reports 135/70, CBG 125, 99.5 oral, 94% RA.

## 2016-07-21 NOTE — ED Provider Notes (Signed)
Time Seen: Approximately 0815  I have reviewed the triage notes  Chief Complaint: Slurred speech   History of Present Illness: Casey Sanford is a 83 y.o. female who has a history of chronic atrial fibrillation and some mild dementia. Patient's had some history of a cerebrovascular accident and is having some transient ischemic attack symptoms over the last 2 weeks. The patient's had episodes of "" dizziness"" according to the family along with possible right-sided weakness. This morning the patient had difficulty with speech. This is cleared by the time the patient had arrived to the emergency department. She was found to have the slurred speech at 7:30. She denies any pain such as a headache, chest pain or shortness of breath.   Past Medical History:  Diagnosis Date  . A-fib (HCC)   . Dementia   . Hypertension   . Thyroid disease   . TIA (transient ischemic attack)     Patient Active Problem List   Diagnosis Date Noted  . CVA (cerebral vascular accident) (HCC) 07/21/2016  . Atherosclerosis of native arteries of the extremities with ulceration (HCC) 06/29/2016  . Chronic ulcer of right great toe (HCC) 06/29/2016  . Essential hypertension 06/29/2016  . Hypothyroidism 06/29/2016    Past Surgical History:  Procedure Laterality Date  . CHOLECYSTECTOMY    . PERIPHERAL VASCULAR CATHETERIZATION Right 06/09/2015   Procedure: Lower Extremity Angiography;  Surgeon: Renford Dills, MD;  Location: [facility] INVASIVE CV LAB;  Service: Cardiovascular;  Laterality: Right;  . PERIPHERAL VASCULAR CATHETERIZATION Right 06/09/2015   Procedure: Lower Extremity Intervention;  Surgeon: Renford Dills, MD;  Location: [facility] INVASIVE CV [facility];  Service: Cardiovascular;  Laterality: Right;    Past Surgical History:  Procedure Laterality Date  . CHOLECYSTECTOMY    . PERIPHERAL VASCULAR CATHETERIZATION Right 06/09/2015   Procedure: Lower Extremity Angiography;  Surgeon: Renford Dills, MD;   Location: [facility] INVASIVE CV [facility];  Service: Cardiovascular;  Laterality: Right;  . PERIPHERAL VASCULAR CATHETERIZATION Right 06/09/2015   Procedure: Lower Extremity Intervention;  Surgeon: Renford Dills, MD;  Location: [facility] INVASIVE CV [facility];  Service: Cardiovascular;  Laterality: Right;    Current Outpatient Rx  . Order #: [id number] Class: Historical Med  . Order #: [id number] Class: Historical Med  . Order #: [id number] Class: Historical Med  . Order #: [id number] Class: Historical Med  . Order #: [id number] Class: Historical Med  . Order #: [id number] Class: Historical Med  . Order #: [id number] Class: Historical Med  . Order #: [id number] Class: Historical Med  . Order #: [id number] Class: Historical Med  . Order #: [id number] Class: Historical Med  . Order #: [id number] Class: Historical Med  . Order #: [id number] Class: Print  . Order #: [id number] Class: Print    Allergies:  Sulfamethoxazole-trimethoprim and Cephalexin  Family History: Family History  Problem Relation Age of Onset  . Hypertension Mother     Social History: Social History  Substance Use Topics  . Smoking status: Never Smoker  . Smokeless tobacco: Not on file  . Alcohol use 0.6 oz/week    1 Glasses of wine per week     Comment: nightly     Review of Systems:   10 point review of systems was performed and was otherwise negative: History and review of systems was acquired from [facility] the patient, nursing staff, and medical record along with the family Constitutional: No fever Eyes: No visual disturbances ENT: No sore throat, ear pain Cardiac: No chest pain Respiratory: No shortness of breath, wheezing, or stridor Abdomen: No abdominal pain, no  vomiting, No diarrhea Endocrine: No weight loss, No night sweats Extremities: No peripheral edema, cyanosis Skin: No rashes, easy bruising Neurologic: No current focal weakness, speech has returned and is noted to be normal by the family Urologic: No dysuria, Hematuria, or urinary  frequency   Physical Exam:  ED Triage Vitals  Enc Vitals Group     BP 07/21/16 0818 (!) 160/81     Pulse Rate 07/21/16 0818 96     Resp 07/21/16 0818 17     Temp 07/21/16 0818 98.1 F (36.7 C)     Temp Source 07/21/16 0818 Oral     SpO2 07/21/16 0818 100 %     Weight 07/21/16 0819 152 lb 1.9 oz (69 kg)     Height 07/21/16 0819 5\' 5"  (1.651 m)     Head Circumference --      Peak Flow --      Pain Score --      Pain Loc --      Pain Edu? --      Excl. in GC? --     General: Awake , Alert , and Oriented times2, GCS 15 Head: Normal cephalic , atraumatic Eyes: Pupils equal , round, reactive to light Nose/Throat: No nasal drainage, patent upper airway without erythema or exudate.  Neck: Supple, Full range of motion, No anterior adenopathy or palpable thyroid masses Lungs: Clear to ascultation without wheezes , rhonchi, or rales Heart: Irregular rate, irregular rhythm without murmurs gallops or rubs  Abdomen: Soft, non tender without rebound, guarding , or rigidity; bowel sounds positive and symmetric in all 4 quadrants. No organomegaly .        Extremities: 2 plus symmetric pulses. No edema, clubbing or cyanosis Neurologic: normal ambulation, Motor symmetric without deficits, sensory intact Skin: warm, dry, no rashes   Labs:   All laboratory work was reviewed including any pertinent negatives or positives listed below:  Labs Reviewed  CBC WITH DIFFERENTIAL/PLATELET - Abnormal; Notable for the following:       Result Value   RBC 3.79 (*)    MCH 34.8 (*)    RDW 14.9 (*)    All other components within normal limits  COMPREHENSIVE METABOLIC PANEL - Abnormal; Notable for the following:    Glucose, Bld 116 (*)    GFR calc non Af Amer 54 (*)    All other components within normal limits  TROPONIN I  Laboratory work was reviewed and showed no clinically significant abnormalities.   EKG: ED ECG REPORT I, Jennye Moccasin, the attending physician, personally viewed and  interpreted this ECG.  Date: 07/21/2016 EKG Time: 0816 Rate:92Rhythm: Atrial fibrillation QRS Axis: normal Intervals: normal ST/T Wave abnormalities: normal Conduction Disturbances: none Narrative Interpretation: unremarkable No acute ischemic changes   Radiology:  "Ct Head Wo Contrast  Result Date: 07/21/2016 CLINICAL DATA:  Slurred speech this morning at 7:30, representing a change. EXAM: CT HEAD WITHOUT CONTRAST TECHNIQUE: Contiguous axial images were obtained from the base of the skull through the vertex without intravenous contrast. COMPARISON:  02/05/2015 FINDINGS: Brain: Remote lacunar infarct in the left globus pallidus nucleus. Chronic appearing encephalomalacia in the left occipital lobe posterior to the occipital horn left lateral ventricle, this was also present on 02/05/2015. Mild ex vacuo prominence of lateral ventricles. Otherwise, the brainstem, cerebellum, cerebral peduncles, thalami, basal ganglia, basilar cisterns, and ventricular system appear within normal limits. No intracranial hemorrhage, mass lesion, or acute CVA. Vascular: Posterior circulation atherosclerotic calcification. There is atherosclerotic calcification of the  cavernous carotid arteries bilaterally. Skull: Unremarkable Sinuses/Orbits: Unremarkable Other: No supplemental non-categorized findings. IMPRESSION: 1. No acute intracranial findings. 2. Chronic encephalomalacia in the left occipital lobe posterior to the occipital horn of the left lateral ventricle. 3. Remote lacunar infarct in the left globus pallidus nucleus. 4. Mild ex vacuo prominence of the lateral ventricles. Electronically Signed   By: Gaylyn RongWalter  Liebkemann M.D.   On: 07/21/2016 08:47  "  I personally reviewed the radiologic studies   ED Course: * Patient's stay here was uneventful and she appears to have accelerating transient ischemic attacks with various symptoms over the last 2 weeks. At this time she appears to be at baseline. I felt she was  not a code stroke though initiated a stroke evaluation. Head CT shows no acute abnormalities. The patient has seen cardiology along with her primary physician and is currently only on aspirin therapy. She apparently was on Plavix for a very brief period of time.     Assessment: * Transient ischemic attacks Atrial fibrillation   Final Clinical Impression  Final diagnoses:  Transient cerebral ischemia, unspecified type     Plan:  Inpatient            Jennye MoccasinBrian S Milbern Doescher, MD 07/21/16 [id number]

## 2016-07-21 NOTE — ED Notes (Signed)
Patient transported to CT 

## 2016-07-21 NOTE — Evaluation (Addendum)
Clinical/Bedside Swallow Evaluation Patient Details  Name: Casey BakerHazel H Kratzke MRN: [id number] Date of Birth: 1923/04/09  Today's Date: 07/21/2016 Time: SLP Start Time (ACUTE ONLY): 1615 SLP Stop Time (ACUTE ONLY): 1715 SLP Time Calculation (min) (ACUTE ONLY): 60 min  Past Medical History:  Past Medical History:  Diagnosis Date  . A-fib (HCC)   . Dementia   . Hypertension   . Thyroid disease   . TIA (transient ischemic attack)    Past Surgical History:  Past Surgical History:  Procedure Laterality Date  . CHOLECYSTECTOMY    . PERIPHERAL VASCULAR CATHETERIZATION Right 06/09/2015   Procedure: Lower Extremity Angiography;  Surgeon: Renford DillsGregory G Schnier, MD;  Location: [facility] INVAS[facility];  Service: Cardiovascular;  Laterality: Right;  . PERIPHERAL VASCULAR CATHETERIZATION Right 06/09/2015   Procedure: Lower Extremity Intervention;  Surgeon: Renford DillsGregory G Schnier, MD;  Location: [facility];  Service: Cardiovascular;  Laterality: Right;   HPI:    Pt is a 83 y.o. female who [facility] with a TIA, and right sided facial droop. Pt. PMHx includes: Dementia, A-Fib, HTN, and Thyroid Disease. Pt does require cues and support at baseline secondary to her Cognitive decline per family report.  Assessment / Plan / Recommendation Clinical Impression  Pt appears at reduced risk for aspiration at this evaluation. Pt exhibited no overt s/s of aspiration w/ all trials; consumed thin liquids and puree/soft solids w/ no overt s/s of aspiration noted. Oral phase c/b timely bolus management and adequate oral clearing. Pt's slight-min decreased labial tone on the R side side did not significantly impact the oral phase of swallowing. Pt noted min wetness in R corner intermittently which she wiped/cleared independently. Pt would benefit from a Dysphagia level 3 diet w/ meats cut for her; gravy added to moisten. Recommend general aspiration precautions; pills w/ applesauce if needed for easier swallowing.  Family indicated pt's appetite and overall intake has declined as she as had Dementia (5 years).     Aspiration Risk   (reduced )    Diet Recommendation  Dysphagia level 3(mech soft) w/ gravy added to the chopped meats; thin liquids. NO Straws.   Medication Administration: Whole meds with liquid (but give in Puree for easier swallowing if needed)    Other  Recommendations Recommended Consults:  (Dietician f/u as needed) Oral Care Recommendations: Oral care BID;Staff/trained caregiver to provide oral care   Follow up Recommendations None      Frequency and Duration min 2x/week  1 week       Prognosis Prognosis for Safe Diet Advancement: Good Barriers to Reach Goals: Cognitive deficits      Swallow Study   General Date of Onset: 07/21/16 Type of Study: Bedside Swallow Evaluation Previous Swallow Assessment: none noted Diet Prior to this Study: Regular;Thin liquids ("soft foods") Temperature Spikes Noted: No (wbc not elevated) Respiratory Status: Room air History of Recent Intubation: No Behavior/Cognition: Alert;Cooperative;Pleasant mood;Confused;Distractible;Requires cueing Oral Cavity Assessment: Within Functional Limits Oral Care Completed by SLP: Recent completion by staff Oral Cavity - Dentition: Adequate natural dentition Vision: Functional for self-feeding Self-Feeding Abilities: Able to feed self;Needs set up Patient Positioning: Upright in bed Baseline Vocal Quality: Normal Volitional Cough: Strong Volitional Swallow: Able to elicit    Oral/Motor/Sensory Function Overall Oral Motor/Sensory Function: Mild impairment Facial ROM: Reduced right (min) Facial Symmetry: Abnormal symmetry right (min) Facial Strength: Reduced right (min) Facial Sensation: Within Functional Limits Lingual ROM: Within Functional Limits Lingual Symmetry: Within Functional Limits Lingual Strength: Within Functional Limits Lingual  Sensation: Within Functional Limits Velum: Within  Functional Limits Mandible: Within Functional Limits   Ice Chips Ice chips: Not tested   Thin Liquid Thin Liquid: Within functional limits Presentation: Cup;Self Fed (~3 ozs total) Other Comments: does not use straws at home    Nectar Thick Nectar Thick Liquid: Not tested   Honey Thick Honey Thick Liquid: Not tested   Puree Puree: Within functional limits Presentation: Spoon (fed; 2 trials)   Solid   GO   Solid: Within functional limits Presentation: Spoon (fed; 2 trials) Other Comments: leaving for test          Jerilynn Som, MS, CCC-SLP Charmian Forbis 07/21/2016,5:37 PM

## 2016-07-21 NOTE — H&P (Signed)
[employer] - [place] [facility]   PATIENT NAME: Casey Sanford    MR#:  [id number]  DATE OF BIRTH:  21-Dec-1922  DATE OF ADMISSION:  07/21/2016  PRIMARY CARE PHYSICIAN: Jaclyn Shaggy, MD   REQUESTING/REFERRING PHYSICIAN: Lacretia Nicks MD  CHIEF COMPLAINT:   Chief Complaint  Patient presents with  . Slurred speech    HISTORY OF PRESENT ILLNESS: Casey Sanford  is a 83 y.o. female with a known history of Dementia, hypertension, hypothyroidism who has a previous history of having TIA in the past as well as atrial fibrillation who has not been anticoagulated due to advanced age. She is brought to the ER with episodes of intermittent slurred speech as well as facial droop that has persisted. Patient also has had some issues with unsteady gait. But has not had any falls. And has not had any bleeding in the past. Patient's CT scan of the head was negative here   PAST MEDICAL HISTORY:   Past Medical History:  Diagnosis Date  . A-fib (HCC)   . Dementia   . Hypertension   . Thyroid disease   . TIA (transient ischemic attack)     PAST SURGICAL HISTORY:  Past Surgical History:  Procedure Laterality Date  . CHOLECYSTECTOMY    . PERIPHERAL VASCULAR CATHETERIZATION Right 06/09/2015   Procedure: Lower Extremity Angiography;  Surgeon: Renford Dills, MD;  Location: [facility] INVASIVE CV LAB;  Service: Cardiovascular;  Laterality: Right;  . PERIPHERAL VASCULAR CATHETERIZATION Right 06/09/2015   Procedure: Lower Extremity Intervention;  Surgeon: Renford Dills, MD;  Location: [facility] INVASIVE CV [facility];  Service: Cardiovascular;  Laterality: Right;    SOCIAL HISTORY:  Social History  Substance Use Topics  . Smoking status: Never Smoker  . Smokeless tobacco: Not on file  . Alcohol use 0.6 oz/week    1 Glasses of wine per week     Comment: nightly    FAMILY HISTORY:  Family History  Problem Relation Age of Onset  . Hypertension Mother     DRUG ALLERGIES:  Allergies   Allergen Reactions  . Sulfamethoxazole-Trimethoprim Rash  . Cephalexin Itching    REVIEW OF SYSTEMS:   CONSTITUTIONAL: No fever, fatigue or weakness.  EYES: No blurred or double vision.  EARS, NOSE, AND THROAT: No tinnitus or ear pain.  RESPIRATORY: No cough, shortness of breath, wheezing or hemoptysis.  CARDIOVASCULAR: No chest pain, orthopnea, edema.  GASTROINTESTINAL: No nausea, vomiting, diarrhea or abdominal pain.  GENITOURINARY: No dysuria, hematuria.  ENDOCRINE: No polyuria, nocturia,  HEMATOLOGY: No anemia, easy bruising or bleeding SKIN: No rash or lesion. MUSCULOSKELETAL: No joint pain or arthritis.   NEUROLOGIFacial droop , slurred speech and gait instability  PychiatTRY: No anxiety or depression.   MEDICATIONS AT HOME:  Prior to Admission medications   Medication Sig Start Date End Date Taking? Authorizing Provider  aspirin 81 MG chewable tablet Chew by mouth daily.   Yes Historical Provider, MD  docusate sodium (COLACE) 100 MG capsule Take 100 mg by mouth every other day.    Yes Historical Provider, MD  furosemide (LASIX) 20 MG tablet Take 1 tablet by mouth daily. 01/14/15  Yes Historical Provider, MD  guaifenesin (ROBITUSSIN) 100 MG/5ML syrup Take 100 mg by mouth every 6 (six) hours as needed for cough.   Yes Historical Provider, MD  hydrocortisone cream 1 % Apply 1 application topically every 12 (twelve) hours as needed for itching.   Yes Historical Provider, MD  levothyroxine (SYNTHROID, LEVOTHROID) 50 MCG tablet Take  1 tablet by mouth daily. 01/21/15  Yes Historical Provider, MD  losartan (COZAAR) 50 MG tablet Take 1 tablet by mouth daily. 01/26/15  Yes Historical Provider, MD  meloxicam (MOBIC) 7.5 MG tablet Take 7.5 mg by mouth daily.   Yes Historical Provider, MD  Potassium Chloride CR (MICRO-K) 8 MEQ CPCR capsule CR Take 1 capsule by mouth daily. 01/14/15  Yes Historical Provider, MD  Propylene Glycol (SYSTANE BALANCE) 0.6 % SOLN Apply 1 drop to eye 3 (three) times  daily.   Yes Historical Provider, MD  risperiDONE (RISPERDAL) 0.5 MG tablet Take 1 tablet by mouth at bedtime.  01/27/15  Yes Historical Provider, MD  clopidogrel (PLAVIX) 75 MG tablet Take 1 tablet (75 mg total) by mouth daily. Patient not taking: Reported on 06/29/2016 06/10/15   Renford DillsGregory G Schnier, MD  doxycycline (VIBRA-TABS) 100 MG tablet Take 1 tablet (100 mg total) by mouth 2 (two) times daily. Patient not taking: Reported on 07/21/2016 02/06/15   Myrna Blazeravid Matthew Schaevitz, MD      PHYSICAL EXAMINATION:   VITAL SIGNS: Blood pressure (!) 171/93, pulse 95, temperature 98.1 F (36.7 C), resp. rate 19, height 5\' 5"  (1.651 m), weight 152 lb 1.9 oz (69 kg), SpO2 96 %.  GENERAL:  83 y.o.-year-old patient lying in the bed with no acute distress.  EYES: Pupils equal, round, reactive to light and accommodation. No scleral icterus. Extraocular muscles intact.  HEENT: Head atraumatic, normocephalic. Oropharynx and nasopharynx clear.  NECK:  Supple, no jugular venous distention. No thyroid enlargement, no tenderness.  LUNGS: Normal breath sounds bilaterally, no wheezing, rales,rhonchi or crepitation. No use of accessory muscles of respiration.  CARDIOVASCULAR: irregularly irregular l. No murmurs, rubs, or gallops.  ABDOMEN: Soft, nontender, nondistended. Bowel sounds present. No organomegaly or mass.  EXTREMITIES: No pedal edema, cyanosis, or clubbing.  NEUROLOGIC: Cranial nerves II through XII are intleft-sided facial droop right lower extremity strength 4 out of 5 wrist is 5 out of 5TRIC: The patient is alert and oriented x 3.  SKIN: No obvious rash, lesion, or ulcer.   LABORATORY PANEL:   CBC  Recent Labs Lab 07/21/16 0824  WBC 9.1  HGB 13.2  HCT 37.5  PLT 229  MCV 98.8  MCH 34.8*  MCHC 35.2  RDW 14.9*  LYMPHSABS 1.8  MONOABS 0.8  EOSABS 0.7  BASOSABS 0.0    ------------------------------------------------------------------------------------------------------------------  Chemistries   Recent Labs Lab 07/21/16 1002  NA 135  K 4.3  CL 104  CO2 26  GLUCOSE 116*  BUN 16  CREATININE 0.90  CALCIUM 9.1  AST 22  ALT 16  ALKPHOS 85  BILITOT 0.7   ------------------------------------------------------------------------------------------------------------------ estimated creatinine clearance is 39.7 mL/min (by C-G formula based on SCr of 0.9 mg/dL). ------------------------------------------------------------------------------------------------------------------ No results for input(s): TSH, T4TOTAL, T3FREE, THYROIDAB in the last 72 hours.  Invalid input(s): FREET3   Coagulation profile No results for input(s): INR, PROTIME in the last 168 hours. ------------------------------------------------------------------------------------------------------------------- No results for input(s): DDIMER in the last 72 hours. -------------------------------------------------------------------------------------------------------------------  Cardiac Enzymes  Recent Labs Lab 07/21/16 1002  TROPONINI <0.03   ------------------------------------------------------------------------------------------------------------------ Invalid input(s): POCBNP  ---------------------------------------------------------------------------------------------------------------  Urinalysis    Component Value Date/Time   COLORURINE YELLOW (A) 02/05/2015 2223   APPEARANCEUR CLEAR (A) 02/05/2015 2223   LABSPEC 1.016 02/05/2015 2223   PHURINE 6.0 02/05/2015 2223   GLUCOSEU NEGATIVE 02/05/2015 2223   HGBUR NEGATIVE 02/05/2015 2223   BILIRUBINUR NEGATIVE 02/05/2015 2223   KETONESUR NEGATIVE 02/05/2015 2223   PROTEINUR NEGATIVE 02/05/2015 2223  NITRITE NEGATIVE 02/05/2015 2223   LEUKOCYTESUR NEGATIVE 02/05/2015 2223     RADIOLOGY: Ct Head Wo  Contrast  Result Date: 07/21/2016 CLINICAL DATA:  Slurred speech this morning at 7:30, representing a change. EXAM: CT HEAD WITHOUT CONTRAST TECHNIQUE: Contiguous axial images were obtained from the base of the skull through the vertex without intravenous contrast. COMPARISON:  02/05/2015 FINDINGS: Brain: Remote lacunar infarct in the left globus pallidus nucleus. Chronic appearing encephalomalacia in the left occipital lobe posterior to the occipital horn left lateral ventricle, this was also present on 02/05/2015. Mild ex vacuo prominence of lateral ventricles. Otherwise, the brainstem, cerebellum, cerebral peduncles, thalami, basal ganglia, basilar cisterns, and ventricular system appear within normal limits. No intracranial hemorrhage, mass lesion, or acute CVA. Vascular: Posterior circulation atherosclerotic calcification. There is atherosclerotic calcification of the cavernous carotid arteries bilaterally. Skull: Unremarkable Sinuses/Orbits: Unremarkable Other: No supplemental non-categorized findings. IMPRESSION: 1. No acute intracranial findings. 2. Chronic encephalomalacia in the left occipital lobe posterior to the occipital horn of the left lateral ventricle. 3. Remote lacunar infarct in the left globus pallidus nucleus. 4. Mild ex vacuo prominence of the lateral ventricles. Electronically Signed   By: Gaylyn Rong M.D.   On: 07/21/2016 08:47    EKG: Orders placed or performed during the hospital encounter of 07/21/16  . EKG 12-Lead  . EKG 12-Lead  . ED EKG  . ED EKG  . EKG 12-Lead  . EKG 12-Lead    IMPRESSION AND PLAN: Patient is a 83 year old white female with history of atrial fibrillation not on anticoagulation presents with symptoms concerning for CVA   1. Acute CVA  We will admit patient do a CVA workup I will obtain MRI and MRA of the brain, carotid Dopplers of the neck Echocardiogram of the heart PT evaluation Neurology consult   patient with atrial fibrillation I have  discussed with the patient and her daughters regarding risk and benefits of being on full anticoagulation including a fall risk and bleeding in the head. As well as GI bleed. They also understand that if she is not on any Anticoagulation she is at very high risk of stroke they're agreeable to start patient on Eliquis We'll start her on lipid-lowering medication  2. Accelerated hypertension continue losartan in light of a possible acute CVA we will not decrease her blood pressure significantly  3. Hypothyroidism continue Synthroid  4. CODE STATUS DO NOT RESUSCITATE confirmed with patient's daughter  All the records are reviewed and case discussed with ED provider. Management plans discussed with the patient, family and they are in agreement.  CODE STATUS: Code Status History    Date Active Date Inactive Code Status Order ID Comments User Context   06/09/2015 12:19 PM 06/09/2015  5:49 PM Full Code [id number]  Renford Dills, MD Inpatient       TOTAL TIME TAKING CARE OF THIS PATIENT: .    Auburn Bilberry M.D on 07/21/2016 at 12:34 PM  Between 7am to 6pm - Pager - [phone]  After 6pm go to [email or web address] - password [email or web address] [facility]  [facility]  Office  [phone]  CC: Primary care physician; Jaclyn Shaggy, MD

## 2016-07-21 NOTE — ED Notes (Signed)
Lab called, recollect on green top not reading on the instrument.

## 2016-07-21 NOTE — Progress Notes (Addendum)
ANTICOAGULATION CONSULT NOTE - Initial Consult  Pharmacy Consult for Apixaban  Indication: stroke/TIA/A. FIb  Allergies  Allergen Reactions  . Sulfamethoxazole-Trimethoprim Rash  . Cephalexin Itching    Patient Measurements: Height: 5\' 5"  (165.1 cm) Weight: 152 lb 1.9 oz (69 kg) IBW/kg (Calculated) : 57  Vital Signs: Temp: 98.1 F (36.7 C) (02/02 0848) Temp Source: Oral (02/02 0818) BP: 160/97 (02/02 1230) Pulse Rate: 96 (02/02 1230)  Labs:  Recent Labs  07/21/16 0824 07/21/16 1002  HGB 13.2  --   HCT 37.5  --   PLT 229  --   CREATININE  --  0.90  TROPONINI  --  <0.03    Estimated Creatinine Clearance: 39.7 mL/min (by C-G formula based on SCr of 0.9 mg/dL).   Medical History: Past Medical History:  Diagnosis Date  . A-fib (HCC)   . Dementia   . Hypertension   . Thyroid disease   . TIA (transient ischemic attack)     Assessment: 83 yo female with CVA and PMH of A. FIb. Patient was not on any anticoagulation. Pharmacy consulted for initiation  of apixaban.    Plan:  Will start patient on apixaban (Eliquis) 5mg   twice daily.  Monitor patient for S/Sx of bleeding.    Gardner CandleSheema M Amante Fomby, PharmD, BCPS Clinical Pharmacist 07/21/2016 1:11 PM

## 2016-07-22 ENCOUNTER — Inpatient Hospital Stay: Payer: Medicare Other

## 2016-07-22 DIAGNOSIS — I63412 Cerebral infarction due to embolism of left middle cerebral artery: Secondary | ICD-10-CM

## 2016-07-22 LAB — LIPID PANEL
CHOL/HDL RATIO: 4.6 ratio
Cholesterol: 207 mg/dL — ABNORMAL HIGH (ref 0–200)
HDL: 45 mg/dL (ref >40)
LDL CALC: 147 mg/dL — AB (ref 0–99)
Triglycerides: 76 mg/dL (ref <150)
VLDL: 15 mg/dL (ref 0–40)

## 2016-07-22 MED ORDER — SODIUM CHLORIDE 0.9% FLUSH
3.0000 mL | Freq: Two times a day (BID) | INTRAVENOUS | Status: DC
  Administered 2016-07-22 – 2016-07-23 (×3): 3 mL via INTRAVENOUS

## 2016-07-22 NOTE — NC FL2 (Signed)
[place] MEDICAID FL2 LEVEL OF CARE SCREENING TOOL     IDENTIFICATION  Patient Name: Casey Sanford Birthdate: [birth year] Sex: female Admission Date (Current Location): 07/21/2016  [place] and [place] Number:  Chiropodist and Address:  [facility], [address], [place], [place] [address]      Provider Number: [id number]  Attending Physician Name and Address:  Katharina Caper, MD  Relative Name and Phone Number:       Current Level of Care: Hospital Recommended Level of Care: Assisted Living Facility Prior Approval Number:    Date Approved/Denied:   PASRR Number:    Discharge Plan: Other (Comment) (ALF)    Current Diagnoses: Patient Active Problem List   Diagnosis Date Noted  . CVA (cerebral vascular accident) (HCC) 07/21/2016  . Atherosclerosis of native arteries of the extremities with ulceration (HCC) 06/29/2016  . Chronic ulcer of right great toe (HCC) 06/29/2016  . Essential hypertension 06/29/2016  . Hypothyroidism 06/29/2016    Orientation RESPIRATION BLADDER Height & Weight     Self  Normal Continent Weight: 125 lb 6.4 oz (56.9 kg) Height:  5\' 4"  (162.6 cm)  BEHAVIORAL SYMPTOMS/MOOD NEUROLOGICAL BOWEL NUTRITION STATUS      Continent Diet (Dysphagia 3 (mechanical soft);Thin liquid (meats cut))  AMBULATORY STATUS COMMUNICATION OF NEEDS Skin   Supervision Verbally Normal                       Personal Care Assistance Level of Assistance  Bathing, Feeding, Dressing Bathing Assistance: Limited assistance Feeding assistance: Independent Dressing Assistance: Limited assistance     Functional Limitations Info             SPECIAL CARE FACTORS FREQUENCY                       Contractures Contractures Info: Present    Additional Factors Info  Allergies   Allergies Info: Sulfamethoxazole-trimethoprim, Cephalexin           Current Medications (07/22/2016):  This is the current hospital active  medication list Current Facility-Administered Medications  Medication Dose Route Frequency Provider Last Rate Last Dose  . acetaminophen (TYLENOL) tablet 650 mg  650 mg Oral Q4H PRN Auburn Bilberry, MD       Or  . acetaminophen (TYLENOL) solution 650 mg  650 mg Per Tube Q4H PRN Auburn Bilberry, MD       Or  . acetaminophen (TYLENOL) suppository 650 mg  650 mg Rectal Q4H PRN Auburn Bilberry, MD      . apixaban (ELIQUIS) tablet 5 mg  5 mg Oral BID Sheema M Hallaji, RPH   5 mg at 07/22/16 0943  . aspirin chewable tablet 81 mg  81 mg Oral Daily Auburn Bilberry, MD   81 mg at 07/22/16 0943  . atorvastatin (LIPITOR) tablet 40 mg  40 mg Oral q1800 Auburn Bilberry, MD   40 mg at 07/21/16 1941  . furosemide (LASIX) tablet 20 mg  20 mg Oral Daily Auburn Bilberry, MD   20 mg at 07/22/16 0943  . guaifenesin (ROBITUSSIN) 100 MG/5ML syrup 100 mg  100 mg Oral Q6H PRN Auburn Bilberry, MD      . levothyroxine (SYNTHROID, LEVOTHROID) tablet 50 mcg  50 mcg Oral QAC breakfast Auburn Bilberry, MD   50 mcg at 07/22/16 0943  . losartan (COZAAR) tablet 50 mg  50 mg Oral Daily Auburn Bilberry, MD   50 mg at 07/22/16 0943  .  meloxicam (MOBIC) tablet 7.5 mg  7.5 mg Oral Daily Auburn BilberryShreyang Patel, MD   7.5 mg at 07/22/16 0943  . polyvinyl alcohol (LIQUIFILM TEARS) 1.4 % ophthalmic solution 1 drop  1 drop Both Eyes TID Auburn BilberryShreyang Patel, MD   1 drop at 07/22/16 0943  . risperiDONE (RISPERDAL) tablet 0.5 mg  0.5 mg Oral QHS Auburn BilberryShreyang Patel, MD   0.5 mg at 07/21/16 2119  . senna-docusate (Senokot-S) tablet 1 tablet  1 tablet Oral QHS PRN Auburn BilberryShreyang Patel, MD      . sodium chloride flush (NS) 0.9 % injection 3 mL  3 mL Intravenous Q12H Katharina Caperima [name], MD   3 mL at 07/22/16 1406     Discharge Medications: DISCHARGE MEDICATIONS:       Current Discharge Medication List        START taking these medications   Details  apixaban (ELIQUIS) 5 MG TABS tablet Take 1 tablet (5 mg total) by mouth 2 (two) times daily. Qty: 60 tablet, Refills: 6     atorvastatin (LIPITOR) 40 MG tablet Take 1 tablet (40 mg total) by mouth daily at 6 PM. Qty: 30 tablet, Refills: 6    metoprolol tartrate (LOPRESSOR) 25 MG tablet Take 1 tablet (25 mg total) by mouth 2 (two) times daily. Qty: 60 tablet, Refills: 6    senna-docusate (SENOKOT-S) 8.6-50 MG tablet Take 1 tablet by mouth at bedtime as needed for mild constipation. Qty: 30 tablet, Refills: 6          CONTINUE these medications which have NOT CHANGED   Details  aspirin 81 MG chewable tablet Chew by mouth daily.    docusate sodium (COLACE) 100 MG capsule Take 100 mg by mouth every other day.     furosemide (LASIX) 20 MG tablet Take 1 tablet by mouth daily.    guaifenesin (ROBITUSSIN) 100 MG/5ML syrup Take 100 mg by mouth every 6 (six) hours as needed for cough.    hydrocortisone cream 1 % Apply 1 application topically every 12 (twelve) hours as needed for itching.    levothyroxine (SYNTHROID, LEVOTHROID) 50 MCG tablet Take 1 tablet by mouth daily.    losartan (COZAAR) 50 MG tablet Take 1 tablet by mouth daily.    Potassium Chloride CR (MICRO-K) 8 MEQ CPCR capsule CR Take 1 capsule by mouth daily.    Propylene Glycol (SYSTANE BALANCE) 0.6 % SOLN Apply 1 drop to eye 3 (three) times daily.    risperiDONE (RISPERDAL) 0.5 MG tablet Take 1 tablet by mouth at bedtime.          STOP taking these medications     meloxicam (MOBIC) 7.5 MG tablet      clopidogrel (PLAVIX) 75 MG tablet      doxycycline (VIBRA-TABS) 100 MG tablet          Relevant Imaging Results:  Relevant Lab Results:   Additional Information SS# [id number]  Judi CongKaren M White, LCSW

## 2016-07-22 NOTE — Clinical Social Work Note (Signed)
Clinical Social Work Assessment  Patient Details  Name: Casey BakerHazel H Sanford MRN: [id number] Date of Birth: Mar 10, 1923  Date of referral:  07/22/16               Reason for consult:  Facility Placement                Permission sought to share information with:    Permission granted to share information::  Yes, Verbal Permission Granted  Name::        Agency::     Relationship::     Contact Information:     Housing/Transportation Living arrangements for the past 2 months:  Assisted Living Facility Source of Information:  Adult Children Patient Interpreter Needed:  None Criminal Activity/Legal Involvement Pertinent to Current Situation/Hospitalization:  No - Comment as needed Significant Relationships:  Adult Children Lives with:  Facility Resident Do you feel safe going back to the place where you live?  Yes Need for family participation in patient care:  No (Coment)  Care giving concerns:  [facility]   Social Worker assessment / plan:  CSW contacted patient's daughter Casey Sanford to confirm that the patient will return to [facility] at discharge. Casey Sanford confirmed and gave verbal permission to contact the ALF. Casey Sanford reported that she will transport the patient at discharge. CSW will con't to follow for dc planning.  Employment status:  Retired Health and safety inspectornsurance information:  Medicare PT Recommendations:  Home with Home Health Information / Referral to community resources:     Patient/Family's Response to care:  Patient's daughter thanked CSW for assistance.  Patient/Family's Understanding of and Emotional Response to Diagnosis, Current Treatment, and Prognosis:  Patient's daughter aware of PT recommendation for HHPT, and she is in agreement.  Emotional Assessment Appearance:  Appears stated age Attitude/Demeanor/Rapport:   (Pleasant) Affect (typically observed):  Accepting Orientation:  Oriented to Self Alcohol / Substance use:  Never Used Psych involvement  (Current and /or in the community):  No (Comment)  Discharge Needs  Concerns to be addressed:  Care Coordination Readmission within the last 30 days:  No Current discharge risk:  None Barriers to Discharge:  Continued Medical Work up   [employer] CorporationKaren M Brody Kump, LCSW 07/22/2016, 3:16 PM

## 2016-07-22 NOTE — Progress Notes (Signed)
Speech Language Pathology Treatment: Dysphagia  Patient Details Name: Casey Sanford MRN: [id number] DOB: [birth year] Today's Date: 07/22/2016 Time: 3151-7616 SLP Time Calculation (min) (ACUTE ONLY): 40 min  Assessment / Plan / Recommendation Clinical Impression  Pt appears at her baseline w/ swallowing; she is tolerating her current Dysphagia 3(chopped meats) w/ thin liquids diet w/ no overt s/s of aspiration noted. Pt tolerated her dinner and breakfast meals well per family members feeding herself w/ setup given. Pt does NOT use straws at baseline to drink.  Education discussed on general aspiration precautions; OMEs(labial) for strengthening the R upper labial droop. Encouraged pt and family members to talk using the open-mouth and exaggerated, louder speech to improve labial tone/ROM. Daughters agreed. ST will f/u w/ pt's status while admitted for any further services indicated.     HPI  Pt is a 83 y.o. female who was admitted to [facility] with a TIA, and right sided facial droop. Pt. PMHx includes: Dementia, A-Fib, HTN, and Thyroid Disease. Pt does require cues and support at baseline secondary to her Cognitive decline per family report.       SLP Plan  All goals met     Recommendations  Diet recommendations: Dysphagia 3 (mechanical soft);Thin liquid (meats cut) Liquids provided via: Cup;No straw (as her baseilne) Medication Administration: Whole meds with liquid (but w/ Puree if any difficulty swallowing w/ liquids) Supervision: Staff to assist with self feeding;Intermittent supervision to cue for compensatory strategies Compensations: Minimize environmental distractions;Slow rate;Small sips/bites;Lingual sweep for clearance of pocketing;Follow solids with liquid Postural Changes and/or Swallow Maneuvers: Seated upright 90 degrees;Upright 30-60 min after meal                General recommendations:  (Dietician f/u) Oral Care Recommendations: Oral care BID;Staff/trained caregiver to  provide oral care Follow up Recommendations: None Plan: All goals met       GO                Orinda Kenner, MS, CCC-SLP Lanaya Bennis 07/22/2016, 12:12 PM

## 2016-07-22 NOTE — Progress Notes (Signed)
Pt has not had ECHO yet with pt and family waiting for study to assess for discharge today. No answer in ECHO. FU done with Sarita BottomA. Perez, RN, charge nurse with Dr. Morley Kos[place]icukute notified ECHO has not been done and pt/family awaiting discharge home determination. Dr. Morley Kos[place]ute with call and speak with family. Dgt reports they have decided to stay tonight and have ECHO done in a.m.  MRI results were also given to family. Continue stroke care.

## 2016-07-22 NOTE — Progress Notes (Signed)
[facility] [place] [facility]   PATIENT NAME: Casey Sanford    MR#:  [id number]  DATE OF BIRTH:  02-20-1923  SUBJECTIVE:  CHIEF COMPLAINT:   Chief Complaint  Patient presents with  . Slurred speech  The patient is 83 year old Caucasian female with medical history significant for history of atrial fibrillation, not on anticoagulation, dementia, hypertension, thyroid disease, TIA, who presents with complaints of slurred speech, expressive aphasia. Patient is better today, per patient's family, however, not back to baseline. She still has some slurring of the speech at as well as inability to express herself. EKG revealed A. fib, rate controlled  Review of Systems  Unable to perform ROS: Dementia  Constitutional: Negative for chills, fever and weight loss.  HENT: Negative for congestion.   Eyes: Negative for blurred vision and double vision.  Respiratory: Negative for cough, sputum production, shortness of breath and wheezing.   Cardiovascular: Negative for chest pain, palpitations, orthopnea, leg swelling and PND.  Gastrointestinal: Negative for abdominal pain, blood in stool, constipation, diarrhea, nausea and vomiting.  Genitourinary: Negative for dysuria, frequency, hematuria and urgency.  Musculoskeletal: Negative for falls.  Neurological: Negative for dizziness, tremors, focal weakness and headaches.  Endo/Heme/Allergies: Does not bruise/bleed easily.  Psychiatric/Behavioral: Negative for depression. The patient does not have insomnia.     VITAL SIGNS: Blood pressure (!) 163/71, pulse 75, temperature 97.7 F (36.5 C), temperature source Oral, resp. rate 20, height 5\' 4"  (1.626 m), weight 56.9 kg (125 lb 6.4 oz), SpO2 98 %.  PHYSICAL EXAMINATION:   GENERAL:  83 y.o.-year-old patient lying in the bed with no acute distress. Able to answer simple questions appropriately, is angry, not able to discuss extensively. Request to be released home. Denies  and discomfort, pain or weakness EYES: Pupils equal, round, reactive to light and accommodation. No scleral icterus. Extraocular muscles intact.  HEENT: Head atraumatic, normocephalic. Oropharynx and nasopharynx clear.  NECK:  Supple, no jugular venous distention. No thyroid enlargement, no tenderness.  LUNGS: Normal breath sounds bilaterally, no wheezing, rales,rhonchi or crepitation. No use of accessory muscles of respiration.  CARDIOVASCULAR: S1, S2 . Rhythm was irregularly irregular. 4/6 systolic murmur radiating to the neck, no rubs, or gallops.  ABDOMEN: Soft, nontender, nondistended. Bowel sounds present. No organomegaly or mass.  EXTREMITIES: Trace pedal edema, cyanosis, or clubbing.  NEUROLOGIC: Cranial nerves II through XII reveal a right facial weakness, forehead. He was spared. Muscle strength 4/5 in right sided extremities, 5 out of 5 in left-sided extremities. Sensation grossly intact. Gait not checked. Tongue deviated to the left PSYCHIATRIC: The patient is alert and oriented x 3. Some difficulty expressing herself. No significant slurred speech noted SKIN: No obvious rash, lesion, or ulcer.   ORDERS/RESULTS REVIEWED:   CBC  Recent Labs Lab 07/21/16 0824  WBC 9.1  HGB 13.2  HCT 37.5  PLT 229  MCV 98.8  MCH 34.8*  MCHC 35.2  RDW 14.9*  LYMPHSABS 1.8  MONOABS 0.8  EOSABS 0.7  BASOSABS 0.0   ------------------------------------------------------------------------------------------------------------------  Chemistries   Recent Labs Lab 07/21/16 1002  NA 135  K 4.3  CL 104  CO2 26  GLUCOSE 116*  BUN 16  CREATININE 0.90  CALCIUM 9.1  AST 22  ALT 16  ALKPHOS 85  BILITOT 0.7   ------------------------------------------------------------------------------------------------------------------ estimated creatinine clearance is 35.2 mL/min (by C-G formula based on SCr of 0.9  mg/dL). ------------------------------------------------------------------------------------------------------------------ No results for input(s): TSH, T4TOTAL, T3FREE, THYROIDAB in the last 72 hours.  Invalid input(s):  FREET3  Cardiac Enzymes  Recent Labs Lab 07/21/16 1002  TROPONINI <0.03   ------------------------------------------------------------------------------------------------------------------ Invalid input(s): POCBNP ---------------------------------------------------------------------------------------------------------------  RADIOLOGY: Ct Head Wo Contrast  Result Date: 07/21/2016 CLINICAL DATA:  Slurred speech this morning at 7:30, representing a change. EXAM: CT HEAD WITHOUT CONTRAST TECHNIQUE: Contiguous axial images were obtained from the base of the skull through the vertex without intravenous contrast. COMPARISON:  02/05/2015 FINDINGS: Brain: Remote lacunar infarct in the left globus pallidus nucleus. Chronic appearing encephalomalacia in the left occipital lobe posterior to the occipital horn left lateral ventricle, this was also present on 02/05/2015. Mild ex vacuo prominence of lateral ventricles. Otherwise, the brainstem, cerebellum, cerebral peduncles, thalami, basal ganglia, basilar cisterns, and ventricular system appear within normal limits. No intracranial hemorrhage, mass lesion, or acute CVA. Vascular: Posterior circulation atherosclerotic calcification. There is atherosclerotic calcification of the cavernous carotid arteries bilaterally. Skull: Unremarkable Sinuses/Orbits: Unremarkable Other: No supplemental non-categorized findings. IMPRESSION: 1. No acute intracranial findings. 2. Chronic encephalomalacia in the left occipital lobe posterior to the occipital horn of the left lateral ventricle. 3. Remote lacunar infarct in the left globus pallidus nucleus. 4. Mild ex vacuo prominence of the lateral ventricles. Electronically Signed   By: Gaylyn Rong M.D.    On: 07/21/2016 08:47   US Carotid Bilateral (at [facility] And [facility] Only)  Result Date: 07/22/2016 CLINICAL DATA:  Stroke.  History of hypertension.  History PAD. EXAM: BILATERAL CAROTID DUPLEX ULTRASOUND TECHNIQUE: Wallace Cullens scale imaging, color Doppler and duplex ultrasound were performed of bilateral carotid and vertebral arteries in the neck. COMPARISON:  None. FINDINGS: Criteria: Quantification of carotid stenosis is based on velocity parameters that correlate the residual internal carotid diameter with NASCET-based stenosis levels, using the diameter of the distal internal carotid lumen as the denominator for stenosis measurement. The following velocity measurements were obtained: RIGHT ICA:  N/A CCA:  86/0 cm/sec SYSTOLIC ICA/CCA RATIO:  N/A DIASTOLIC ICA/CCA RATIO:  N/A ECA:  402 cm/sec LEFT ICA:  90/21 cm/sec CCA:  73/10 cm/sec SYSTOLIC ICA/CCA RATIO:  1.2 DIASTOLIC ICA/CCA RATIO:  2.2 ECA:  94 cm/sec RIGHT CAROTID ARTERY: There is a moderate amount of eccentric mixed echogenic plaque within the right carotid bulb (image 17). There is occlusive hypoechoic plaque throughout the interrogated course of the right internal carotid artery (images 22, 28 and 41). RIGHT VERTEBRAL ARTERY:  Antegrade Flow LEFT CAROTID ARTERY: There is a moderate amount of eccentric mixed echogenic plaque within the left carotid bulb (images 61 and 62, extending to involve the origin and proximal aspect the left internal carotid artery (image 69), not definitely resulting in elevated peak systolic velocities within the interrogated course of the left internal carotid artery to suggest a hemodynamically significant stenosis. LEFT VERTEBRAL ARTERY:  Antegrade flow IMPRESSION: 1. Age-indeterminate occlusion of the right internal carotid artery. 2. Moderate amount of left-sided atherosclerotic plaque, not resulting in elevated peak systolic velocities within the left internal carotid artery, though note, velocity measurements are unreliable in the  setting of a contralateral occlusion. Further evaluation CTA could be performed as clinically indicated. 3. Antegrade flow demonstrated within the bilateral vertebral arteries. Electronically Signed   By: Simonne Come M.D.   On: 07/22/2016 08:11    EKG:  Orders placed or performed during the hospital encounter of 07/21/16  . EKG 12-Lead  . EKG 12-Lead  . ED EKG  . ED EKG  . EKG 12-Lead  . EKG 12-Lead    ASSESSMENT AND PLAN:  Active Problems:   CVA (cerebral vascular accident) (  HCC) #1. Stroke with right-sided weakness and expressive aphasia, continue patient on aspirin and Eliquis for now, since patient's stroke is considered to be embolic, awaiting for MRI of the brain, ultrasound of carotid arteries revealed chronically occluded right ICA, unremarkable left ICA, continue patient on Lipitor, SLP, OT, PT consults are pending. Neurology consultation is pending #2. Hyperlipidemia, initiated on Lipitor #3. Essential hypertension, continue Cozaar, permissive hypertension due to concerns of stroke #4. Cardiac murmur, echocardiogram is pending #5. A. fib, now on Eliquis, discussed this patient's family in regards to weakness and possible falls on anticoagulation, they accept the risks at this time   Management plans discussed with the patient, family and they are in agreement.   DRUG ALLERGIES:  Allergies  Allergen Reactions  . Sulfamethoxazole-Trimethoprim Rash  . Cephalexin Itching    CODE STATUS:     Code Status Orders        Start     Ordered   07/21/16 1521  Full code  Continuous     07/21/16 1520    Code Status History    Date Active Date Inactive Code Status Order ID Comments User Context   06/09/2015 12:19 PM 06/09/2015  5:49 PM Full Code [id number]  Renford Dills, MD Inpatient    Advance Directive Documentation   Flowsheet Row Most Recent Value  Type of Advance Directive  Healthcare Power of Attorney  Pre-existing out of facility DNR order (yellow form or pink  MOST form)  No data  "MOST" Form in Place?  No data      TOTAL TIME TAKING CARE OF THIS PATIENT: 40 minutes.  Discussed with 3 daughters, patient, all questions were answered  Saniyah Mondesir M.D on 07/22/2016 at 12:29 PM  Between 7am to 6pm - Pager - [phone]  After 6pm go to [email or web address] - password [email or web address] [facility]  [facility]  Office  [phone]  CC: Primary care physician; Jaclyn Shaggy, MD

## 2016-07-22 NOTE — Evaluation (Signed)
Physical Therapy Evaluation Patient Details Name: Casey Sanford MRN: [id number] DOB: 1923/04/13 Today's Date: 07/22/2016   History of Present Illness  Pt. is a 83 y.o. female who was admitted to [facility] with a TIA, and right sided facial droop. Pt. PMHx includes: Dementia, A-Fib, HTN, and Thyroid Disease.  Clinical Impression  Patient is quite pleasant throughout evaluation. Per daughter present patient is typically quite active, she uses RW at baseline, but has had 2 instances of buckling or near fall on R side recently. Patient today requires little to no assistance with mobility, though of note her R quadricep was noted to be weaker than L during gait. No sensory deficits, visual deficits, or other strength deficits identified in this evaluation. Her gait speed is roughly normal per daughter, no overt loss of balance or buckling noted in this evaluation. Given the R quadricep weakness, she will be at higher risk for falling, would recommend HHPT upon discharge to address.     Follow Up Recommendations Home health PT    Equipment Recommendations       Recommendations for Other Services       Precautions / Restrictions Precautions Precautions: Fall Restrictions Weight Bearing Restrictions: No      Mobility  Bed Mobility Overal bed mobility: Needs Assistance Bed Mobility: Supine to Sit     Supine to sit: Min guard;HOB elevated     General bed mobility comments: No physical assistance to complete transfer, HOB was elevated while completing.   Transfers Overall transfer level: Needs assistance Equipment used: Rolling walker (2 wheeled) Transfers: Sit to/from Stand Sit to Stand: Min guard         General transfer comment: Patient is able to complete transfer with RW, no cuing but guarding for balance required, no loss of balance demonstrated.   Ambulation/Gait Ambulation/Gait assistance: Supervision Ambulation Distance (Feet): 200 Feet   Gait Pattern/deviations:  Decreased step length - right;Decreased step length - left;Decreased stance time - right;Trunk flexed;Narrow base of support   Gait velocity interpretation: at or above normal speed for age/gender General Gait Details: Patient demonstrates increased R knee flexion in stance phase on RLE likely indicative of quadricep weakness, no buckling observed. She does have RW anterior to her COG, however daughter says this is baseline.   Stairs            Wheelchair Mobility    Modified Rankin (Stroke Patients Only)       Balance Overall balance assessment: Needs assistance Sitting-balance support: No upper extremity supported Sitting balance-Leahy Scale: Good     Standing balance support: Bilateral upper extremity supported Standing balance-Leahy Scale: Fair                               Pertinent Vitals/Pain Pain Assessment: No/denies pain    Home Living Family/patient expects to be discharged to:: Assisted living               Home Equipment: Walker - 2 wheels      Prior Function Level of Independence: Needs assistance      ADL's / Homemaking Assistance Needed: Pt. has assist for morning Bathing/dressing. Pt. was modified Independent with tolieting care needs.  Comments: Per daughter patient is still very active and is able to go to hair salon and church with daughter. Uses RW for mobility assistance.      Hand Dominance        Extremity/Trunk Assessment   Upper  Extremity Assessment Upper Extremity Assessment: Overall WFL for tasks assessed    Lower Extremity Assessment Lower Extremity Assessment: Overall WFL for tasks assessed (Minor weakness on R knee extension relative to L)       Communication   Communication: No difficulties  Cognition Arousal/Alertness: Awake/alert Behavior During Therapy: WFL for tasks assessed/performed Overall Cognitive Status: History of cognitive impairments - at baseline                      General  Comments General comments (skin integrity, edema, etc.): R sided facial droop noted    Exercises     Assessment/Plan    PT Assessment Patient needs continued PT services  PT Problem List Decreased strength;Decreased mobility;Decreased balance          PT Treatment Interventions DME instruction;Therapeutic activities;Therapeutic exercise;Gait training;Stair training;Balance training;Neuromuscular re-education    PT Goals (Current goals can be found in the Care Plan section)  Acute Rehab PT Goals Patient Stated Goal: To return home PT Goal Formulation: With patient/family Time For Goal Achievement: 08/05/16 Potential to Achieve Goals: Good    Frequency Min 2X/week   Barriers to discharge        Co-evaluation               End of Session Equipment Utilized During Treatment: Gait belt Activity Tolerance: Patient tolerated treatment well Patient left: in chair;with chair alarm set;with call bell/phone within reach;with family/visitor present Nurse Communication: Mobility status    Functional Assessment Tool Used: Clinical judgement  Functional Limitation: Mobility: Walking and moving around Mobility: Walking and Moving Around Current Status [phone](G8978): At least 1 percent but less than 20 percent impaired, limited or restricted Mobility: Walking and Moving Around Goal Status [phone](G8979): At least 1 percent but less than 20 percent impaired, limited or restricted    Time: 0910-0927 PT Time Calculation (min) (ACUTE ONLY): 17 min   Charges:   PT Evaluation $PT Eval Moderate Complexity: 1 Procedure     PT G Codes:   PT G-Codes **NOT FOR INPATIENT CLASS** Functional Assessment Tool Used: Clinical judgement  Functional Limitation: Mobility: Walking and moving around Mobility: Walking and Moving Around Current Status ([id number]([id number]): At least 1 percent but less than 20 percent impaired, limited or restricted Mobility: Walking and Moving Around Goal Status [phone]([id number]): At least 1 percent  but less than 20 percent impaired, limited or restricted   Kerin RansomPatrick A McNamara, PT, DPT    07/22/2016, 2:32 PM

## 2016-07-22 NOTE — Consult Note (Signed)
Reason for Consult:slurry speech  Referring Physician: Dr. Winona Legato  CC: slurry speech   HPI: Casey Sanford is an 83 y.o. female  with a known history of Dementia, hypertension, hypothyroidism who has a previous history of having TIA in the past as well as atrial fibrillation who has not been anticoagulated  She is brought to the ER with episodes of intermittent slurred speech as well as facial droop that has persisted symptoms improved. CT scan of the head was negative here   Past Medical History:  Diagnosis Date  . A-fib (HCC)   . Dementia   . Hypertension   . Thyroid disease   . TIA (transient ischemic attack)     Past Surgical History:  Procedure Laterality Date  . CHOLECYSTECTOMY    . PERIPHERAL VASCULAR CATHETERIZATION Right 06/09/2015   Procedure: Lower Extremity Angiography;  Surgeon: Renford Dills, MD;  Location: [facility] INVASIVE CV LAB;  Service: Cardiovascular;  Laterality: Right;  . PERIPHERAL VASCULAR CATHETERIZATION Right 06/09/2015   Procedure: Lower Extremity Intervention;  Surgeon: Renford Dills, MD;  Location: [facility] INVASIVE CV LAB;  Service: Cardiovascular;  Laterality: Right;    Family History  Problem Relation Age of Onset  . Hypertension Mother     Social History:  reports that she has never smoked. She has never used smokeless tobacco. She reports that she drinks about 0.6 oz of alcohol per week . Her drug history is not on file.  Allergies  Allergen Reactions  . Sulfamethoxazole-Trimethoprim Rash  . Cephalexin Itching    Medications: I have reviewed the patient's current medications.  ROS: History obtained from the patient  General ROS: negative for - chills, fatigue, fever, night sweats, weight gain or weight loss Psychological ROS: negative for - behavioral disorder, hallucinations, memory difficulties, mood swings or suicidal ideation Ophthalmic ROS: negative for - blurry vision, double vision, eye pain or loss of vision ENT ROS:  negative for - epistaxis, nasal discharge, oral lesions, sore throat, tinnitus or vertigo Allergy and Immunology ROS: negative for - hives or itchy/watery eyes Hematological and Lymphatic ROS: negative for - bleeding problems, bruising or swollen lymph nodes Endocrine ROS: negative for - galactorrhea, hair pattern changes, polydipsia/polyuria or temperature intolerance Respiratory ROS: negative for - cough, hemoptysis, shortness of breath or wheezing Cardiovascular ROS: negative for - chest pain, dyspnea on exertion, edema or irregular heartbeat Gastrointestinal ROS: negative for - abdominal pain, diarrhea, hematemesis, nausea/vomiting or stool incontinence Genito-Urinary ROS: negative for - dysuria, hematuria, incontinence or urinary frequency/urgency Musculoskeletal ROS: negative for - joint swelling or muscular weakness Neurological ROS: as noted in HPI Dermatological ROS: negative for rash and skin lesion changes  Physical Examination: Blood pressure (!) 163/71, pulse 75, temperature 97.7 F (36.5 C), temperature source Oral, resp. rate 20, height 5\' 4"  (1.626 m), weight 56.9 kg (125 lb 6.4 oz), SpO2 98 %.   Neurological Examination Mental Status: Alert, oriented, thought content appropriate.  Mild dysarthria.   Able to follow 3 step commands without difficulty. Cranial Nerves: II: Discs flat bilaterally; Visual fields grossly normal, pupils equal, round, reactive to light and accommodation III,IV, VI: ptosis not present, extra-ocular motions intact bilaterally V,VII: smile symmetric, facial light touch sensation normal bilaterally VIII: hearing normal bilaterally IX,X: gag reflex present XI: bilateral shoulder shrug XII: midline tongue extension Motor: Right : Upper extremity   5/5    Left:     Upper extremity   5/5  Lower extremity   5/5     Lower extremity  5/5 Tone and bulk:normal tone throughout; no atrophy noted Sensory: Pinprick and light touch intact throughout,  bilaterally Deep Tendon Reflexes: 1+ and symmetric throughout Plantars: Right: downgoing   Left: downgoing Cerebellar: normal finger-to-nose, normal rapid alternating movements and normal heel-to-shin test Gait: not tested       Laboratory Studies:   Basic Metabolic Panel:  Recent Labs Lab 07/21/16 1002  NA 135  K 4.3  CL 104  CO2 26  GLUCOSE 116*  BUN 16  CREATININE 0.90  CALCIUM 9.1    Liver Function Tests:  Recent Labs Lab 07/21/16 1002  AST 22  ALT 16  ALKPHOS 85  BILITOT 0.7  PROT 6.7  ALBUMIN 3.6   No results for input(s): LIPASE, AMYLASE in the last 168 hours. No results for input(s): AMMONIA in the last 168 hours.  CBC:  Recent Labs Lab 07/21/16 0824  WBC 9.1  NEUTROABS 5.8  HGB 13.2  HCT 37.5  MCV 98.8  PLT 229    Cardiac Enzymes:  Recent Labs Lab 07/21/16 1002  TROPONINI <0.03    BNP: Invalid input(s): POCBNP  CBG:  Recent Labs Lab 07/21/16 0827  GLUCAP 110*    Microbiology: Results for orders placed or performed during the hospital encounter of 07/21/16  MRSA PCR Screening     Status: None   Collection Time: 07/21/16  4:30 PM  Result Value Ref Range Status   MRSA by PCR NEGATIVE NEGATIVE Final    Comment:        The GeneXpert MRSA Assay (FDA approved for NASAL specimens only), is one component of a comprehensive MRSA colonization surveillance program. It is not intended to diagnose MRSA infection nor to guide or monitor treatment for MRSA infections.     Coagulation Studies: No results for input(s): LABPROT, INR in the last 72 hours.  Urinalysis: No results for input(s): COLORURINE, LABSPEC, PHURINE, GLUCOSEU, HGBUR, BILIRUBINUR, KETONESUR, PROTEINUR, UROBILINOGEN, NITRITE, LEUKOCYTESUR in the last 168 hours.  Invalid input(s): APPERANCEUR  Lipid Panel:     Component Value Date/Time   CHOL 207 (H) 07/22/2016 0531   TRIG 76 07/22/2016 0531   HDL 45 07/22/2016 0531   CHOLHDL 4.6 07/22/2016 0531   VLDL  15 07/22/2016 0531   LDLCALC 147 (H) 07/22/2016 0531    HgbA1C: No results found for: HGBA1C  Urine Drug Screen:  No results found for: LABOPIA, COCAINSCRNUR, LABBENZ, AMPHETMU, THCU, LABBARB  Alcohol Level: No results for input(s): ETH in the last 168 hours.  Imaging: Ct Head Wo Contrast  Result Date: 07/21/2016 CLINICAL DATA:  Slurred speech this morning at 7:30, representing a change. EXAM: CT HEAD WITHOUT CONTRAST TECHNIQUE: Contiguous axial images were obtained from the base of the skull through the vertex without intravenous contrast. COMPARISON:  02/05/2015 FINDINGS: Brain: Remote lacunar infarct in the left globus pallidus nucleus. Chronic appearing encephalomalacia in the left occipital lobe posterior to the occipital horn left lateral ventricle, this was also present on 02/05/2015. Mild ex vacuo prominence of lateral ventricles. Otherwise, the brainstem, cerebellum, cerebral peduncles, thalami, basal ganglia, basilar cisterns, and ventricular system appear within normal limits. No intracranial hemorrhage, mass lesion, or acute CVA. Vascular: Posterior circulation atherosclerotic calcification. There is atherosclerotic calcification of the cavernous carotid arteries bilaterally. Skull: Unremarkable Sinuses/Orbits: Unremarkable Other: No supplemental non-categorized findings. IMPRESSION: 1. No acute intracranial findings. 2. Chronic encephalomalacia in the left occipital lobe posterior to the occipital horn of the left lateral ventricle. 3. Remote lacunar infarct in the left globus pallidus nucleus. 4. Mild ex  vacuo prominence of the lateral ventricles. Electronically Signed   By: Gaylyn RongWalter  Liebkemann M.D.   On: 07/21/2016 08:47   [place] Carotid Bilateral (at [facility] And [facility] Only)  Result Date: 07/22/2016 CLINICAL DATA:  Stroke.  History of hypertension.  History PAD. EXAM: BILATERAL CAROTID DUPLEX ULTRASOUND TECHNIQUE: Wallace CullensGray scale imaging, color Doppler and duplex ultrasound were performed of bilateral  carotid and vertebral arteries in the neck. COMPARISON:  None. FINDINGS: Criteria: Quantification of carotid stenosis is based on velocity parameters that correlate the residual internal carotid diameter with NASCET-based stenosis levels, using the diameter of the distal internal carotid lumen as the denominator for stenosis measurement. The following velocity measurements were obtained: RIGHT ICA:  N/A CCA:  86/0 cm/sec SYSTOLIC ICA/CCA RATIO:  N/A DIASTOLIC ICA/CCA RATIO:  N/A ECA:  402 cm/sec LEFT ICA:  90/21 cm/sec CCA:  73/10 cm/sec SYSTOLIC ICA/CCA RATIO:  1.2 DIASTOLIC ICA/CCA RATIO:  2.2 ECA:  94 cm/sec RIGHT CAROTID ARTERY: There is a moderate amount of eccentric mixed echogenic plaque within the right carotid bulb (image 17). There is occlusive hypoechoic plaque throughout the interrogated course of the right internal carotid artery (images 22, 28 and 41). RIGHT VERTEBRAL ARTERY:  Antegrade Flow LEFT CAROTID ARTERY: There is a moderate amount of eccentric mixed echogenic plaque within the left carotid bulb (images 61 and 62, extending to involve the origin and proximal aspect the left internal carotid artery (image 69), not definitely resulting in elevated peak systolic velocities within the interrogated course of the left internal carotid artery to suggest a hemodynamically significant stenosis. LEFT VERTEBRAL ARTERY:  Antegrade flow IMPRESSION: 1. Age-indeterminate occlusion of the right internal carotid artery. 2. Moderate amount of left-sided atherosclerotic plaque, not resulting in elevated peak systolic velocities within the left internal carotid artery, though note, velocity measurements are unreliable in the setting of a contralateral occlusion. Further evaluation CTA could be performed as clinically indicated. 3. Antegrade flow demonstrated within the bilateral vertebral arteries. Electronically Signed   By: Simonne ComeJohn  Watts M.D.   On: 07/22/2016 08:11     Assessment/Plan:  83 y.o. female  with a  known history of Dementia, hypertension, hypothyroidism who has a previous history of having TIA in the past as well as atrial fibrillation who has not been anticoagulated  She is brought to the ER with episodes of intermittent slurred speech as well as facial droop that has persisted symptoms improved. CT scan of the head was negative for acute abnormalities.  - MRI brain prelim shows L BG infarcts as well as L occipital infarct in setting of A-fib  - Anticoagulation and d/c planning - Pt/ot - s/p discussion with family at bedside.  Pauletta BrownsZEYLIKMAN, Javonne Louissaint   07/22/2016, 1:56 PM

## 2016-07-22 NOTE — Evaluation (Signed)
Occupational Therapy Evaluation Patient Details Name: Casey Sanford MRN: [id number] DOB: 05-05-1923 Today's Date: 07/22/2016    History of Present Illness Pt. is a 83 y.o. female who [facility] with a [facility]IA, and right sided facial droop. Pt. PMHx includes: Dementia, A-Fib, HTN, and Thyroid Disease.   Clinical Impression   Pt. Is a 83 y.o. Female who [facility]. [place] Pt. Grip strength, Right: 30#, Left: 20#. Pt. Is actively using her bilateral UE's and hands to complete ADL tasks without difficulty. No further skilled OT services are warranted at this time, at this level of care. Initial RUE deficits are resolving.      Follow Up Recommendations  No OT follow up    Equipment Recommendations       Recommendations for Other Services       Precautions / Restrictions Precautions Precautions: Fall Restrictions Weight Bearing Restrictions: No         Balance Overall balance assessment: Needs assistance Sitting-balance support: No upper extremity supported Sitting balance-Leahy Scale: Good     Standing balance support: Bilateral upper extremity supported Standing balance-Leahy Scale: Fair                              ADL Overall ADL's : Needs assistance/impaired Eating/Feeding: Set up   Grooming: Set up               Lower Body Dressing: Min guard                 General ADL Comments: Pt. education was provided about Bilateral hand use during ADL functioning.     Vision     Perception     Praxis      Pertinent Vitals/Pain Pain Assessment: 0-10     Hand Dominance     Extremity/Trunk Assessment Upper Extremity Assessment Upper Extremity Assessment: Overall WFL for tasks assessed          Communication Communication Communication: No difficulties   Cognition Arousal/Alertness: Awake/alert Behavior During Therapy: WFL for tasks assessed/performed Overall Cognitive Status:  History of cognitive impairments - at baseline                     General Comments       Exercises       Shoulder Instructions      Home Living Family/patient expects to be discharged to:: Assisted living                             Home Equipment: Walker - 2 wheels          Prior Functioning/Environment Level of Independence: Needs assistance    ADL's / Homemaking Assistance Needed: Pt. has assist for morning Bathing/dressing. Pt. was modified Independent with tolieting care needs.   Comments: Per daughter patient is still very active and is able to go to hair salon and church with daughter. Uses RW for mobility assistance.         OT Problem List:     OT Treatment/Interventions:      OT Goals(Current goals can be found in the care plan section) Acute Rehab OT Goals Patient Stated Goal: To return home OT Goal Formulation: With patient/family Potential to Achieve Goals: Good  OT Frequency:     Barriers to D/C:  Co-evaluation              End of Session    Activity Tolerance: Patient tolerated treatment well Patient left: in chair;with call bell/phone within reach;with chair alarm set   Time: 0981-1914 OT Time Calculation (min): 28 min Charges:  OT General Charges $OT Visit: 1 Procedure OT Evaluation $OT Eval Moderate Complexity: 1 Procedure G-Codes:    Olegario Messier, MS, OTR/L 07/22/2016, 11:26 AM

## 2016-07-23 ENCOUNTER — Inpatient Hospital Stay (HOSPITAL_COMMUNITY)
Admit: 2016-07-23 | Discharge: 2016-07-23 | Disposition: A | Discharge status: Home / Self Care | Payer: Medicare Other | Attending: Internal Medicine | Admitting: Internal Medicine

## 2016-07-23 DIAGNOSIS — R4701 Aphasia: Secondary | ICD-10-CM

## 2016-07-23 DIAGNOSIS — I4891 Unspecified atrial fibrillation: Secondary | ICD-10-CM

## 2016-07-23 DIAGNOSIS — E785 Hyperlipidemia, unspecified: Secondary | ICD-10-CM

## 2016-07-23 DIAGNOSIS — I639 Cerebral infarction, unspecified: Secondary | ICD-10-CM

## 2016-07-23 DIAGNOSIS — I63532 Cerebral infarction due to unspecified occlusion or stenosis of left posterior cerebral artery: Secondary | ICD-10-CM

## 2016-07-23 DIAGNOSIS — I6521 Occlusion and stenosis of right carotid artery: Secondary | ICD-10-CM

## 2016-07-23 DIAGNOSIS — R531 Weakness: Secondary | ICD-10-CM

## 2016-07-23 LAB — ECHOCARDIOGRAM COMPLETE
Height: 64 in
WEIGHTICAEL: 2006.4 [oz_av]

## 2016-07-23 LAB — HEMOGLOBIN A1C
HEMOGLOBIN A1C: 5.8 % — AB (ref 4.8–5.6)
MEAN PLASMA GLUCOSE: 120 mg/dL

## 2016-07-23 MED ORDER — LEVOTHYROXINE SODIUM 50 MCG PO TABS: 50.0000 ug | ORAL_TABLET | Freq: Every day | ORAL | Status: DC

## 2016-07-23 MED ORDER — SENNOSIDES-DOCUSATE SODIUM 8.6-50 MG PO TABS: 1.0000 | ORAL_TABLET | Freq: Every evening | ORAL | 6 refills | Status: DC | PRN

## 2016-07-23 MED ORDER — ATORVASTATIN CALCIUM 40 MG PO TABS: 40.0000 mg | ORAL_TABLET | Freq: Every day | ORAL | 6 refills | Status: DC

## 2016-07-23 MED ORDER — METOPROLOL TARTRATE 25 MG PO TABS
25.0000 mg | ORAL_TABLET | Freq: Two times a day (BID) | ORAL | Status: DC
  Administered 2016-07-23 (×2): 25 mg via ORAL
  Filled 2016-07-23: qty 1

## 2016-07-23 MED ORDER — METOPROLOL TARTRATE 25 MG PO TABS: 25.0000 mg | ORAL_TABLET | Freq: Two times a day (BID) | ORAL | 6 refills | Status: DC

## 2016-07-23 MED ORDER — APIXABAN 5 MG PO TABS: 5.0000 mg | ORAL_TABLET | Freq: Two times a day (BID) | ORAL | 6 refills | Status: DC

## 2016-07-23 NOTE — Progress Notes (Signed)
Neuro consult done. ECHO done. Oral and written AVS instructions given to 3 dgts including pt with questions answered to their satisfaction. Prescriptions given in discharge packet to be [facility] by [facility]. Family transporting pt in their private vehicle.

## 2016-07-23 NOTE — Consult Note (Signed)
Reason for Consult:slurry speech  Referring Physician: Dr. Winona LegatoVaickute  CC: slurry speech   HPI: Casey Sanford is an 83 y.o. female  with a known history of Dementia, hypertension, hypothyroidism who has a previous history of having TIA in the past as well as atrial fibrillation who has not been anticoagulated  She is brought to the ER with episodes of intermittent slurred speech as well as facial droop that has persisted symptoms improved. CT scan of the head was negative here  MRI brain showing embolic infarcts in the L occipital and L thalamic stroke.    Past Medical History:  Diagnosis Date  . A-fib (HCC)   . Dementia   . Hypertension   . Thyroid disease   . TIA (transient ischemic attack)     Past Surgical History:  Procedure Laterality Date  . CHOLECYSTECTOMY    . PERIPHERAL VASCULAR CATHETERIZATION Right 06/09/2015   Procedure: Lower Extremity Angiography;  Surgeon: Renford DillsGregory G Schnier, MD;  Location: [facility];  Service: Cardiovascular;  Laterality: Right;  . PERIPHERAL VASCULAR CATHETERIZATION Right 06/09/2015   Procedure: Lower Extremity Intervention;  Surgeon: Renford DillsGregory G Schnier, MD;  Location: [facility];  Service: Cardiovascular;  Laterality: Right;    Family History  Problem Relation Age of Onset  . Hypertension Mother     Social History:  reports that she has never smoked. She has never used smokeless tobacco. She reports that she drinks about 0.6 oz of alcohol per week . Her drug history is not on file.  Allergies  Allergen Reactions  . Sulfamethoxazole-Trimethoprim Rash  . Cephalexin Itching    Medications: I have reviewed the patient's current medications.  ROS: History obtained from the patient  General ROS: negative for - chills, fatigue, fever, night sweats, weight gain or weight loss Psychological ROS: negative for - behavioral disorder, hallucinations, memory difficulties, mood swings or suicidal ideation Ophthalmic ROS: negative  for - blurry vision, double vision, eye pain or loss of vision ENT ROS: negative for - epistaxis, nasal discharge, oral lesions, sore throat, tinnitus or vertigo Allergy and Immunology ROS: negative for - hives or itchy/watery eyes Hematological and Lymphatic ROS: negative for - bleeding problems, bruising or swollen lymph nodes Endocrine ROS: negative for - galactorrhea, hair pattern changes, polydipsia/polyuria or temperature intolerance Respiratory ROS: negative for - cough, hemoptysis, shortness of breath or wheezing Cardiovascular ROS: negative for - chest pain, dyspnea on exertion, edema or irregular heartbeat Gastrointestinal ROS: negative for - abdominal pain, diarrhea, hematemesis, nausea/vomiting or stool incontinence Genito-Urinary ROS: negative for - dysuria, hematuria, incontinence or urinary frequency/urgency Musculoskeletal ROS: negative for - joint swelling or muscular weakness Neurological ROS: as noted in HPI Dermatological ROS: negative for rash and skin lesion changes  Physical Examination: Blood pressure (!) 158/69, pulse 100, temperature 97.5 F (36.4 C), temperature source Oral, resp. rate 20, height 5\' 4"  (1.626 m), weight 56.9 kg (125 lb 6.4 oz), SpO2 97 %.   Neurological Examination Mental Status: Alert, oriented, thought content appropriate.  Mild dysarthria.   Able to follow 3 step commands without difficulty. Cranial Nerves: II: Discs flat bilaterally; Visual fields grossly normal, pupils equal, round, reactive to light and accommodation III,IV, VI: ptosis not present, extra-ocular motions intact bilaterally V,VII: smile symmetric, facial light touch sensation normal bilaterally VIII: hearing normal bilaterally IX,X: gag reflex present XI: bilateral shoulder shrug XII: midline tongue extension Motor: Right : Upper extremity   5/5    Left:     Upper extremity  5/5  Lower extremity   5/5     Lower extremity   5/5 Tone and bulk:normal tone throughout; no  atrophy noted Sensory: Pinprick and light touch intact throughout, bilaterally Deep Tendon Reflexes: 1+ and symmetric throughout Plantars: Right: downgoing   Left: downgoing Cerebellar: normal finger-to-nose, normal rapid alternating movements and normal heel-to-shin test Gait: not tested       Laboratory Studies:   Basic Metabolic Panel:  Recent Labs Lab 07/21/16 1002  NA 135  K 4.3  CL 104  CO2 26  GLUCOSE 116*  BUN 16  CREATININE 0.90  CALCIUM 9.1    Liver Function Tests:  Recent Labs Lab 07/21/16 1002  AST 22  ALT 16  ALKPHOS 85  BILITOT 0.7  PROT 6.7  ALBUMIN 3.6   No results for input(s): LIPASE, AMYLASE in the last 168 hours. No results for input(s): AMMONIA in the last 168 hours.  CBC:  Recent Labs Lab 07/21/16 0824  WBC 9.1  NEUTROABS 5.8  HGB 13.2  HCT 37.5  MCV 98.8  PLT 229    Cardiac Enzymes:  Recent Labs Lab 07/21/16 1002  TROPONINI <0.03    BNP: Invalid input(s): POCBNP  CBG:  Recent Labs Lab 07/21/16 0827  GLUCAP 110*    Microbiology: Results for orders placed or performed during the hospital encounter of 07/21/16  MRSA PCR Screening     Status: None   Collection Time: 07/21/16  4:30 PM  Result Value Ref Range Status   MRSA by PCR NEGATIVE NEGATIVE Final    Comment:        The GeneXpert MRSA Assay (FDA approved for NASAL specimens only), is one component of a comprehensive MRSA colonization surveillance program. It is not intended to diagnose MRSA infection nor to guide or monitor treatment for MRSA infections.     Coagulation Studies: No results for input(s): LABPROT, INR in the last 72 hours.  Urinalysis: No results for input(s): COLORURINE, LABSPEC, PHURINE, GLUCOSEU, HGBUR, BILIRUBINUR, KETONESUR, PROTEINUR, UROBILINOGEN, NITRITE, LEUKOCYTESUR in the last 168 hours.  Invalid input(s): APPERANCEUR  Lipid Panel:     Component Value Date/Time   CHOL 207 (H) 07/22/2016 0531   TRIG 76 07/22/2016  0531   HDL 45 07/22/2016 0531   CHOLHDL 4.6 07/22/2016 0531   VLDL 15 07/22/2016 0531   LDLCALC 147 (H) 07/22/2016 0531    HgbA1C:  Lab Results  Component Value Date   HGBA1C 5.8 (H) 07/22/2016    Urine Drug Screen:  No results found for: LABOPIA, COCAINSCRNUR, LABBENZ, AMPHETMU, THCU, LABBARB  Alcohol Level: No results for input(s): ETH in the last 168 hours.  Imaging: Mr Brain Wo Contrast  Result Date: 07/22/2016 CLINICAL DATA:  TIA symptoms for the past 2 weeks. Dizziness and an episode of right-sided weakness. EXAM: MRI HEAD WITHOUT CONTRAST MRA HEAD WITHOUT CONTRAST TECHNIQUE: Multiplanar, multiecho pulse sequences of the brain and surrounding structures were obtained without intravenous contrast. Angiographic images of the head were obtained using MRA technique without contrast. COMPARISON:  1998 brain MRI is not available for review. Head CT from yesterday FINDINGS: MRI HEAD FINDINGS Brain: Small to moderate area of restricted diffusion in the left occipital pole, cortically based. Tight patchy acute infarct in the central left thalamus, in total measuring up to 2 cm in maximal diameter. No hemorrhagic conversion. Chronic microvascular ischemic change in the cerebral white matter is mild for age. There is volume loss, advanced in the medial temporal lobes. In this patient with history of dementia, Alzheimer's  disease is implied. No mass or hydrocephalus. Vascular: Arterial findings below. Normal dural venous sinus flow voids. Skull and upper cervical spine: No marrow lesion noted. Sinuses/Orbits: Negative Other: Borderline right submental lymph node enlargement, nonspecific in isolation. MRA HEAD FINDINGS Right ICA occlusion. There is intracranial reconstitution from the large posterior communicating artery on the right. Small anterior communicating artery also present, but the right A1 is hypoplastic or stenotic. Right MCA signal is less intense compared to the left, likely due to  circuitous course. No superimposed high-grade narrowing is noted. No noted reversible occlusion. Distal left M2 operculum branch shows a focal high-grade narrowing. Strongly dominant left vertebral artery. Mid basilar fenestration is noted. No visible flow in the left PCA. Suspect there was fetal type PCA on the left, which shows proximal occlusion at the level of the dorsum sella. Negative for aneurysm. IMPRESSION: 1. Acute nonhemorrhagic infarcts in the left occipital cortex and left thalamus. 2. No detectable flow in the left PCA. Additional parenchyma likely at risk. 3. Chronic right ICA occlusion with intracranial reconstitution from large right posterior communicating artery. 4. Brain atrophy as described. Electronically Signed   By: Marnee Spring M.D.   On: 07/22/2016 14:14   US Carotid Bilateral (at [facility] Only)  Result Date: 07/22/2016 CLINICAL DATA:  Stroke.  History of hypertension.  History PAD. EXAM: BILATERAL CAROTID DUPLEX ULTRASOUND TECHNIQUE: Wallace Cullens scale imaging, color Doppler and duplex ultrasound were performed of bilateral carotid and vertebral arteries in the neck. COMPARISON:  None. FINDINGS: Criteria: Quantification of carotid stenosis is based on velocity parameters that correlate the residual internal carotid diameter with NASCET-based stenosis levels, using the diameter of the distal internal carotid lumen as the denominator for stenosis measurement. The following velocity measurements were obtained: RIGHT ICA:  N/A CCA:  86/0 cm/sec SYSTOLIC ICA/CCA RATIO:  N/A DIASTOLIC ICA/CCA RATIO:  N/A ECA:  402 cm/sec LEFT ICA:  90/21 cm/sec CCA:  73/10 cm/sec SYSTOLIC ICA/CCA RATIO:  1.2 DIASTOLIC ICA/CCA RATIO:  2.2 ECA:  94 cm/sec RIGHT CAROTID ARTERY: There is a moderate amount of eccentric mixed echogenic plaque within the right carotid bulb (image 17). There is occlusive hypoechoic plaque throughout the interrogated course of the right internal carotid artery (images 22, 28 and 41).  RIGHT VERTEBRAL ARTERY:  Antegrade Flow LEFT CAROTID ARTERY: There is a moderate amount of eccentric mixed echogenic plaque within the left carotid bulb (images 61 and 62, extending to involve the origin and proximal aspect the left internal carotid artery (image 69), not definitely resulting in elevated peak systolic velocities within the interrogated course of the left internal carotid artery to suggest a hemodynamically significant stenosis. LEFT VERTEBRAL ARTERY:  Antegrade flow IMPRESSION: 1. Age-indeterminate occlusion of the right internal carotid artery. 2. Moderate amount of left-sided atherosclerotic plaque, not resulting in elevated peak systolic velocities within the left internal carotid artery, though note, velocity measurements are unreliable in the setting of a contralateral occlusion. Further evaluation CTA could be performed as clinically indicated. 3. Antegrade flow demonstrated within the bilateral vertebral arteries. Electronically Signed   By: Simonne Come M.D.   On: 07/22/2016 08:11   Mr Maxine Glenn Head/brain ZO Cm  Result Date: 07/22/2016 CLINICAL DATA:  TIA symptoms for the past 2 weeks. Dizziness and an episode of right-sided weakness. EXAM: MRI HEAD WITHOUT CONTRAST MRA HEAD WITHOUT CONTRAST TECHNIQUE: Multiplanar, multiecho pulse sequences of the brain and surrounding structures were obtained without intravenous contrast. Angiographic images of the head were obtained using MRA technique without  contrast. COMPARISON:  1998 brain MRI is not available for review. Head CT from yesterday FINDINGS: MRI HEAD FINDINGS Brain: Small to moderate area of restricted diffusion in the left occipital pole, cortically based. Tight patchy acute infarct in the central left thalamus, in total measuring up to 2 cm in maximal diameter. No hemorrhagic conversion. Chronic microvascular ischemic change in the cerebral white matter is mild for age. There is volume loss, advanced in the medial temporal lobes. In this  patient with history of dementia, Alzheimer's disease is implied. No mass or hydrocephalus. Vascular: Arterial findings below. Normal dural venous sinus flow voids. Skull and upper cervical spine: No marrow lesion noted. Sinuses/Orbits: Negative Other: Borderline right submental lymph node enlargement, nonspecific in isolation. MRA HEAD FINDINGS Right ICA occlusion. There is intracranial reconstitution from the large posterior communicating artery on the right. Small anterior communicating artery also present, but the right A1 is hypoplastic or stenotic. Right MCA signal is less intense compared to the left, likely due to circuitous course. No superimposed high-grade narrowing is noted. No noted reversible occlusion. Distal left M2 operculum branch shows a focal high-grade narrowing. Strongly dominant left vertebral artery. Mid basilar fenestration is noted. No visible flow in the left PCA. Suspect there was fetal type PCA on the left, which shows proximal occlusion at the level of the dorsum sella. Negative for aneurysm. IMPRESSION: 1. Acute nonhemorrhagic infarcts in the left occipital cortex and left thalamus. 2. No detectable flow in the left PCA. Additional parenchyma likely at risk. 3. Chronic right ICA occlusion with intracranial reconstitution from large right posterior communicating artery. 4. Brain atrophy as described. Electronically Signed   By: Marnee Spring M.D.   On: 07/22/2016 14:14     Assessment/Plan:  83 y.o. female  with a known history of Dementia, hypertension, hypothyroidism who has a previous history of having TIA in the past as well as atrial fibrillation who has not been anticoagulated  She is brought to the ER with episodes of intermittent slurred speech as well as facial droop that has persisted symptoms improved. CT scan of the head was negative for acute abnormalities. - MRI brain prelim shows embolic infarcts - Anticoagulation and d/c planning today  -  Semaj Coburn,  Keri Tavella   07/23/2016, 1:36 PM

## 2016-07-23 NOTE — Progress Notes (Signed)
Transported in transport chair to private vehicle [place].

## 2016-07-23 NOTE — Discharge Summary (Signed)
[facility] [place] at [facility]   PATIENT NAME: Casey Sanford    MR#:  [id number]  DATE OF BIRTH:  [birth year]  DATE OF ADMISSION:  07/21/2016 ADMITTING PHYSICIAN: Auburn Bilberry, MD  DATE OF DISCHARGE: 07/23/2016  PRIMARY CARE PHYSICIAN: Jaclyn Shaggy, MD     ADMISSION DIAGNOSIS:  Transient cerebral ischemia, unspecified type [G45.9]  DISCHARGE DIAGNOSIS:  Principal Problem:   CVA (cerebral vascular accident) (HCC) Active Problems:   Acute ischemic left PCA stroke (HCC)   Right sided weakness   Expressive aphasia   Hyperlipidemia   Atrial fibrillation (HCC)   Generalized weakness   ICAO (internal carotid artery occlusion), right   SECONDARY DIAGNOSIS:   Past Medical History:  Diagnosis Date  . A-fib (HCC)   . Dementia   . Hypertension   . Thyroid disease   . TIA (transient ischemic attack)     .pro HOSPITAL COURSE:  The patient is 83 year old Caucasian female with medical history significant for history of atrial fibrillation, not on anticoagulation, dementia, hypertension, thyroid disease, TIA, who presents with complaints of slurred speech, expressive aphasia. Patient is better today, per patient's family, however, not back to baseline. She still has some slurring of the speech at as well as inability to express herself. EKG revealed A. fib, rate controlled. Initial head CT revealed chronic changes, encephalomalacia in the left occipital lobe and globus pallidus. Remote lacunar infarcts. Patient had ultrasound of carotid arteries, revealing chronic right ICA occlusion, moderate amount of atherosclerotic plaque on the left, antegrade flow of both vertebrals. MRI and MRA of the brain revealed acute left occipital infarct, left thalamus infarct, no flow in left PCA, chronically occluded right ICA with collaterals from right posterior communicating artery. Patient was seen by neurologist recommended to be initiated in Eliquis, to continue aspirin  therapy. Patient's lipid profile was checked and was found to be abnormal, LDL is 147, total cholesterol 207, HDL 45, triglycerides of 76, Lipitor was recommended. Patient was seen by speech therapist, occupational therapist and physical therapist and recommended home health services. She was felt to be stable to be discharged back home to assisted living facility with home health services. Discussion by problem: #1. Stroke/acute left PCA stroke with right-sided weakness and expressive aphasia, continue patient on aspirin and Eliquis  MRI /MRA of the brain revealed the left PCA occlusion, chronic right ICA occlusion, acute stroke in occipital cortex and thalamus on the left, ultrasound of carotid arteries revealed chronically occluded right ICA, unremarkable left ICA, continue patient on Lipitor, aspirin, Eliquis  .  SLP, OT, PT, neurologist, evaluated patient and recommended home health services, patient will be discharged to assisted living facility today #2. Hyperlipidemia, initiated on Lipitor #3. Essential hypertension, continue Cozaar, permissive hypertension due to stroke, systolic blood pressure is 158 today #4. Cardiac murmur, echocardiogram is pending #5. A. fib, RVR, initiate patient on metoprolol, continue  Eliquis, discussed with the patient's family in regards to weakness and possible falls on anticoagulation, they accept the risks at this time.  Echocardiogram is pending at this time, it may be necessary to have echocardiogram done as outpatient. DISCHARGE CONDITIONS:   Stable  CONSULTS OBTAINED:  Treatment Team:  Thana Farr, MD Enedina Finner, MD  DRUG ALLERGIES:   Allergies  Allergen Reactions  . Sulfamethoxazole-Trimethoprim Rash  . Cephalexin Itching    DISCHARGE MEDICATIONS:   Current Discharge Medication List    START taking these medications   Details  apixaban (ELIQUIS) 5 MG TABS tablet Take 1  tablet (5 mg total) by mouth 2 (two) times daily. Qty: 60 tablet,  Refills: 6    atorvastatin (LIPITOR) 40 MG tablet Take 1 tablet (40 mg total) by mouth daily at 6 PM. Qty: 30 tablet, Refills: 6    metoprolol tartrate (LOPRESSOR) 25 MG tablet Take 1 tablet (25 mg total) by mouth 2 (two) times daily. Qty: 60 tablet, Refills: 6    senna-docusate (SENOKOT-S) 8.6-50 MG tablet Take 1 tablet by mouth at bedtime as needed for mild constipation. Qty: 30 tablet, Refills: 6      CONTINUE these medications which have NOT CHANGED   Details  aspirin 81 MG chewable tablet Chew by mouth daily.    docusate sodium (COLACE) 100 MG capsule Take 100 mg by mouth every other day.     furosemide (LASIX) 20 MG tablet Take 1 tablet by mouth daily.    guaifenesin (ROBITUSSIN) 100 MG/5ML syrup Take 100 mg by mouth every 6 (six) hours as needed for cough.    hydrocortisone cream 1 % Apply 1 application topically every 12 (twelve) hours as needed for itching.    levothyroxine (SYNTHROID, LEVOTHROID) 50 MCG tablet Take 1 tablet by mouth daily.    losartan (COZAAR) 50 MG tablet Take 1 tablet by mouth daily.    Potassium Chloride CR (MICRO-K) 8 MEQ CPCR capsule CR Take 1 capsule by mouth daily.    Propylene Glycol (SYSTANE BALANCE) 0.6 % SOLN Apply 1 drop to eye 3 (three) times daily.    risperiDONE (RISPERDAL) 0.5 MG tablet Take 1 tablet by mouth at bedtime.       STOP taking these medications     meloxicam (MOBIC) 7.5 MG tablet      clopidogrel (PLAVIX) 75 MG tablet      doxycycline (VIBRA-TABS) 100 MG tablet          DISCHARGE INSTRUCTIONS:    The patient is to follow-up with primary care physician as outpatient  If you experience worsening of your admission symptoms, develop shortness of breath, life threatening emergency, suicidal or homicidal thoughts you must seek medical attention immediately by calling 911 or calling your MD immediately  if symptoms less severe.  You Must read complete instructions/literature along with all the possible adverse  reactions/side effects for all the Medicines you take and that have been prescribed to you. Take any new Medicines after you have completely understood and accept all the possible adverse reactions/side effects.   Please note  You were cared for by a hospitalist during your hospital stay. If you have any questions about your discharge medications or the care you received while you were in the hospital after you are discharged, you can call the unit and asked to speak with the hospitalist on call if the hospitalist that took care of you is not available. Once you are discharged, your primary care physician will handle any further medical issues. Please note that NO REFILLS for any discharge medications will be authorized once you are discharged, as it is imperative that you return to your primary care physician (or establish a relationship with a primary care physician if you do not have one) for your aftercare needs so that they can reassess your need for medications and monitor your lab values.    Today   CHIEF COMPLAINT:   Chief Complaint  Patient presents with  . Slurred speech    HISTORY OF PRESENT ILLNESS:  Allyna Pittsley  is a 83 y.o. female with a known history of atrial  fibrillation, not on anticoagulation, dementia, hypertension, thyroid disease, TIA, who presents with complaints of slurred speech, expressive aphasia. Patient is better today, per patient's family, however, not back to baseline. She still has some slurring of the speech at as well as inability to express herself. EKG revealed A. fib, rate controlled. Initial head CT revealed chronic changes, encephalomalacia in the left occipital lobe and globus pallidus. Remote lacunar infarcts. Patient had ultrasound of carotid arteries, revealing chronic right ICA occlusion, moderate amount of atherosclerotic plaque on the left, antegrade flow of both vertebrals. MRI and MRA of the brain revealed acute left occipital infarct, left thalamus  infarct, no flow in left PCA, chronically occluded right ICA with collaterals from right posterior communicating artery. Patient was seen by neurologist recommended to be initiated in Eliquis, to continue aspirin therapy. Patient's lipid profile was checked and was found to be abnormal, LDL is 147, total cholesterol 207, HDL 45, triglycerides of 76, Lipitor was recommended. Patient was seen by speech therapist, occupational therapist and physical therapist and recommended home health services. She was felt to be stable to be discharged back home to assisted living facility with home health services. Discussion by problem: #1. Stroke/acute left PCA stroke with right-sided weakness and expressive aphasia, continue patient on aspirin and Eliquis  MRI /MRA of the brain revealed the left PCA occlusion, chronic right ICA occlusion, acute stroke in occipital cortex and thalamus on the left, ultrasound of carotid arteries revealed chronically occluded right ICA, unremarkable left ICA, continue patient on Lipitor, aspirin, Eliquis  .  SLP, OT, PT, neurologist, evaluated patient and recommended home health services, patient will be discharged to assisted living facility today #2. Hyperlipidemia, initiated on Lipitor #3. Essential hypertension, continue Cozaar, permissive hypertension due to stroke, systolic blood pressure is 158 today #4. Cardiac murmur, echocardiogram is pending #5. A. fib, RVR, initiate patient on metoprolol, continue  Eliquis, discussed with the patient's family in regards to weakness and possible falls on anticoagulation, they accept the risks at this time . Echocardiogram is pending at this time, it may be necessary to have echocardiogram done as outpatient.    VITAL SIGNS:  Blood pressure (!) 158/69, pulse 100, temperature 97.5 F (36.4 C), temperature source Oral, resp. rate 20, height 5\' 4"  (1.626 m), weight 56.9 kg (125 lb 6.4 oz), SpO2 97 %.  I/O:    Intake/Output Summary (Last 24  hours) at 07/23/16 1134 Last data filed at 07/23/16 1009  Gross per 24 hour  Intake              840 ml  Output              850 ml  Net              -10 ml    PHYSICAL EXAMINATION:  GENERAL:  83 y.o.-year-old patient lying in the bed with no acute distress.  EYES: Pupils equal, round, reactive to light and accommodation. No scleral icterus. Extraocular muscles intact.  HEENT: Head atraumatic, normocephalic. Oropharynx and nasopharynx clear.  NECK:  Supple, no jugular venous distention. No thyroid enlargement, no tenderness.  LUNGS: Normal breath sounds bilaterally, no wheezing, rales,rhonchi or crepitation. No use of accessory muscles of respiration.  CARDIOVASCULAR: S1, S2 normal. No murmurs, rubs, or gallops.  ABDOMEN: Soft, non-tender, non-distended. Bowel sounds present. No organomegaly or mass.  EXTREMITIES: No pedal edema, cyanosis, or clubbing.  NEUROLOGIC: Cranial nerves II through XII are intact. Muscle strength 5/5 in all extremities. Sensation intact. Gait not  checked.  PSYCHIATRIC: The patient is alert and oriented x 3.  SKIN: No obvious rash, lesion, or ulcer.   DATA REVIEW:   CBC  Recent Labs Lab 07/21/16 0824  WBC 9.1  HGB 13.2  HCT 37.5  PLT 229    Chemistries   Recent Labs Lab 07/21/16 1002  NA 135  K 4.3  CL 104  CO2 26  GLUCOSE 116*  BUN 16  CREATININE 0.90  CALCIUM 9.1  AST 22  ALT 16  ALKPHOS 85  BILITOT 0.7    Cardiac Enzymes  Recent Labs Lab 07/21/16 1002  TROPONINI <0.03    Microbiology Results  Results for orders placed or performed during the hospital encounter of 07/21/16  MRSA PCR Screening     Status: None   Collection Time: 07/21/16  4:30 PM  Result Value Ref Range Status   MRSA by PCR NEGATIVE NEGATIVE Final    Comment:        The GeneXpert MRSA Assay (FDA approved for NASAL specimens only), is one component of a comprehensive MRSA colonization surveillance program. It is not intended to diagnose MRSA infection  nor to guide or monitor treatment for MRSA infections.     RADIOLOGY:  Mr Brain Wo Contrast  Result Date: 07/22/2016 CLINICAL DATA:  TIA symptoms for the past 2 weeks. Dizziness and an episode of right-sided weakness. EXAM: MRI HEAD WITHOUT CONTRAST MRA HEAD WITHOUT CONTRAST TECHNIQUE: Multiplanar, multiecho pulse sequences of the brain and surrounding structures were obtained without intravenous contrast. Angiographic images of the head were obtained using MRA technique without contrast. COMPARISON:  1998 brain MRI is not available for review. Head CT from yesterday FINDINGS: MRI HEAD FINDINGS Brain: Small to moderate area of restricted diffusion in the left occipital pole, cortically based. Tight patchy acute infarct in the central left thalamus, in total measuring up to 2 cm in maximal diameter. No hemorrhagic conversion. Chronic microvascular ischemic change in the cerebral white matter is mild for age. There is volume loss, advanced in the medial temporal lobes. In this patient with history of dementia, Alzheimer's disease is implied. No mass or hydrocephalus. Vascular: Arterial findings below. Normal dural venous sinus flow voids. Skull and upper cervical spine: No marrow lesion noted. Sinuses/Orbits: Negative Other: Borderline right submental lymph node enlargement, nonspecific in isolation. MRA HEAD FINDINGS Right ICA occlusion. There is intracranial reconstitution from the large posterior communicating artery on the right. Small anterior communicating artery also present, but the right A1 is hypoplastic or stenotic. Right MCA signal is less intense compared to the left, likely due to circuitous course. No superimposed high-grade narrowing is noted. No noted reversible occlusion. Distal left M2 operculum branch shows a focal high-grade narrowing. Strongly dominant left vertebral artery. Mid basilar fenestration is noted. No visible flow in the left PCA. Suspect there was fetal type PCA on the left,  which shows proximal occlusion at the level of the dorsum sella. Negative for aneurysm. IMPRESSION: 1. Acute nonhemorrhagic infarcts in the left occipital cortex and left thalamus. 2. No detectable flow in the left PCA. Additional parenchyma likely at risk. 3. Chronic right ICA occlusion with intracranial reconstitution from large right posterior communicating artery. 4. Brain atrophy as described. Electronically Signed   By: Marnee Spring M.D.   On: 07/22/2016 14:14   US Carotid Bilateral (at [facility] And [facility] Only)  Result Date: 07/22/2016 CLINICAL DATA:  Stroke.  History of hypertension.  History PAD. EXAM: BILATERAL CAROTID DUPLEX ULTRASOUND TECHNIQUE: Wallace Cullens scale imaging, color Doppler  and duplex ultrasound were performed of bilateral carotid and vertebral arteries in the neck. COMPARISON:  None. FINDINGS: Criteria: Quantification of carotid stenosis is based on velocity parameters that correlate the residual internal carotid diameter with NASCET-based stenosis levels, using the diameter of the distal internal carotid lumen as the denominator for stenosis measurement. The following velocity measurements were obtained: RIGHT ICA:  N/A CCA:  86/0 cm/sec SYSTOLIC ICA/CCA RATIO:  N/A DIASTOLIC ICA/CCA RATIO:  N/A ECA:  402 cm/sec LEFT ICA:  90/21 cm/sec CCA:  73/10 cm/sec SYSTOLIC ICA/CCA RATIO:  1.2 DIASTOLIC ICA/CCA RATIO:  2.2 ECA:  94 cm/sec RIGHT CAROTID ARTERY: There is a moderate amount of eccentric mixed echogenic plaque within the right carotid bulb (image 17). There is occlusive hypoechoic plaque throughout the interrogated course of the right internal carotid artery (images 22, 28 and 41). RIGHT VERTEBRAL ARTERY:  Antegrade Flow LEFT CAROTID ARTERY: There is a moderate amount of eccentric mixed echogenic plaque within the left carotid bulb (images 61 and 62, extending to involve the origin and proximal aspect the left internal carotid artery (image 69), not definitely resulting in elevated peak systolic  velocities within the interrogated course of the left internal carotid artery to suggest a hemodynamically significant stenosis. LEFT VERTEBRAL ARTERY:  Antegrade flow IMPRESSION: 1. Age-indeterminate occlusion of the right internal carotid artery. 2. Moderate amount of left-sided atherosclerotic plaque, not resulting in elevated peak systolic velocities within the left internal carotid artery, though note, velocity measurements are unreliable in the setting of a contralateral occlusion. Further evaluation CTA could be performed as clinically indicated. 3. Antegrade flow demonstrated within the bilateral vertebral arteries. Electronically Signed   By: Simonne Come M.D.   On: 07/22/2016 08:11   Mr Maxine Glenn Head/brain GN Cm  Result Date: 07/22/2016 CLINICAL DATA:  TIA symptoms for the past 2 weeks. Dizziness and an episode of right-sided weakness. EXAM: MRI HEAD WITHOUT CONTRAST MRA HEAD WITHOUT CONTRAST TECHNIQUE: Multiplanar, multiecho pulse sequences of the brain and surrounding structures were obtained without intravenous contrast. Angiographic images of the head were obtained using MRA technique without contrast. COMPARISON:  1998 brain MRI is not available for review. Head CT from yesterday FINDINGS: MRI HEAD FINDINGS Brain: Small to moderate area of restricted diffusion in the left occipital pole, cortically based. Tight patchy acute infarct in the central left thalamus, in total measuring up to 2 cm in maximal diameter. No hemorrhagic conversion. Chronic microvascular ischemic change in the cerebral white matter is mild for age. There is volume loss, advanced in the medial temporal lobes. In this patient with history of dementia, Alzheimer's disease is implied. No mass or hydrocephalus. Vascular: Arterial findings below. Normal dural venous sinus flow voids. Skull and upper cervical spine: No marrow lesion noted. Sinuses/Orbits: Negative Other: Borderline right submental lymph node enlargement, nonspecific in  isolation. MRA HEAD FINDINGS Right ICA occlusion. There is intracranial reconstitution from the large posterior communicating artery on the right. Small anterior communicating artery also present, but the right A1 is hypoplastic or stenotic. Right MCA signal is less intense compared to the left, likely due to circuitous course. No superimposed high-grade narrowing is noted. No noted reversible occlusion. Distal left M2 operculum branch shows a focal high-grade narrowing. Strongly dominant left vertebral artery. Mid basilar fenestration is noted. No visible flow in the left PCA. Suspect there was fetal type PCA on the left, which shows proximal occlusion at the level of the dorsum sella. Negative for aneurysm. IMPRESSION: 1. Acute nonhemorrhagic infarcts in the left  occipital cortex and left thalamus. 2. No detectable flow in the left PCA. Additional parenchyma likely at risk. 3. Chronic right ICA occlusion with intracranial reconstitution from large right posterior communicating artery. 4. Brain atrophy as described. Electronically Signed   By: Marnee SpringJonathon  Watts M.D.   On: 07/22/2016 14:14    EKG:   Orders placed or performed during the hospital encounter of 07/21/16  . EKG 12-Lead  . EKG 12-Lead  . ED EKG  . ED EKG  . EKG 12-Lead  . EKG 12-Lead  . EKG      Management plans discussed with the patient, family and they are in agreement.  CODE STATUS:     Code Status Orders        Start     Ordered   07/21/16 1521  Full code  Continuous     07/21/16 1520    Code Status History    Date Active Date Inactive Code Status Order ID Comments User Context   06/09/2015 12:19 PM 06/09/2015  5:49 PM Full Code [id number]  Renford DillsGregory G Schnier, MD Inpatient    Advance Directive Documentation   Flowsheet Row Most Recent Value  Type of Advance Directive  Healthcare Power of Attorney  Pre-existing out of facility DNR order (yellow form or pink MOST form)  No data  "MOST" Form in Place?  No data       TOTAL TIME TAKING CARE OF THIS PATIENT: 40 minutes.    Katharina CaperVAICKUTE,Kardell Virgil M.D on 07/23/2016 at 11:34 AM  Between 7am to 6pm - Pager - [phone]  After 6pm go to [email or web address] - password [email or web address] [facility]  [facility]  Office  [phone]  CC: Primary care physician; Jaclyn ShaggyATE,DENNY C, MD

## 2016-07-23 NOTE — Care Management Note (Signed)
Case Management Note  Patient Details  Name: Casey Sanford MRN: [id number] Date of Birth: Sep 25, 1922  Subjective/Objective:     Discussed discharge planning with daughter Rico SheehanCarol Hinshaw and with Dr Winona LegatoVaickute. [facility] and [facility]. [facility]. No other discharge needs identified.            Action/Plan:   Expected Discharge Date:  07/23/16               Expected Discharge Plan:     In-House Referral:     Discharge planning Services     Post Acute Care Choice:    Choice offered to:     DME Arranged:    DME Agency:     HH Arranged:    HH Agency:     Status of Service:     If discussed at [employer] Meetings, dates discussed:    Additional Comments:  Savier Trickett A, RN 07/23/2016, 11:36 AM

## 2016-07-23 NOTE — Progress Notes (Signed)
Telemetry called RN who was [facility] elevated HR/vtach. Pt asymptomatic; denies chest pain, SOB, pressures, palpitations or feeling different. Ausculatated HR irregular, rapid. Pt assisted back to bed. VSS. Dr. Morley KosVaicukute in and notified and she will review with RN requested strip.

## 2016-12-28 ENCOUNTER — Encounter (INDEPENDENT_AMBULATORY_CARE_PROVIDER_SITE_OTHER): Payer: Medicare Other

## 2016-12-28 ENCOUNTER — Ambulatory Visit (INDEPENDENT_AMBULATORY_CARE_PROVIDER_SITE_OTHER): Payer: Medicare Other | Admitting: Vascular Surgery

## 2017-02-25 ENCOUNTER — Emergency Department: Payer: Medicare Other

## 2017-02-25 ENCOUNTER — Inpatient Hospital Stay
Admission: EM | Admit: 2017-02-25 | Discharge: 2017-03-01 | DRG: 470 | Disposition: A | Discharge status: Hospice | Payer: Medicare Other | Attending: Internal Medicine | Admitting: Internal Medicine

## 2017-02-25 ENCOUNTER — Inpatient Hospital Stay: Payer: Medicare Other | Admitting: Anesthesiology

## 2017-02-25 ENCOUNTER — Inpatient Hospital Stay: Payer: Medicare Other

## 2017-02-25 ENCOUNTER — Encounter
Admission: EM | Disposition: A | Discharge status: Hospice | Payer: Self-pay | Source: Home / Self Care | Attending: Internal Medicine

## 2017-02-25 DIAGNOSIS — Z515 Encounter for palliative care: Secondary | ICD-10-CM | POA: Diagnosis not present

## 2017-02-25 DIAGNOSIS — Y92099 Unspecified place in other non-institutional residence as the place of occurrence of the external cause: Secondary | ICD-10-CM

## 2017-02-25 DIAGNOSIS — I482 Chronic atrial fibrillation: Secondary | ICD-10-CM | POA: Diagnosis present

## 2017-02-25 DIAGNOSIS — R0600 Dyspnea, unspecified: Secondary | ICD-10-CM | POA: Diagnosis present

## 2017-02-25 DIAGNOSIS — Z8249 Family history of ischemic heart disease and other diseases of the circulatory system: Secondary | ICD-10-CM

## 2017-02-25 DIAGNOSIS — R410 Disorientation, unspecified: Secondary | ICD-10-CM | POA: Diagnosis not present

## 2017-02-25 DIAGNOSIS — Z993 Dependence on wheelchair: Secondary | ICD-10-CM | POA: Diagnosis not present

## 2017-02-25 DIAGNOSIS — I739 Peripheral vascular disease, unspecified: Secondary | ICD-10-CM | POA: Diagnosis present

## 2017-02-25 DIAGNOSIS — Z7982 Long term (current) use of aspirin: Secondary | ICD-10-CM | POA: Diagnosis not present

## 2017-02-25 DIAGNOSIS — E785 Hyperlipidemia, unspecified: Secondary | ICD-10-CM | POA: Diagnosis present

## 2017-02-25 DIAGNOSIS — Z8673 Personal history of transient ischemic attack (TIA), and cerebral infarction without residual deficits: Secondary | ICD-10-CM

## 2017-02-25 DIAGNOSIS — W19XXXA Unspecified fall, initial encounter: Secondary | ICD-10-CM | POA: Diagnosis present

## 2017-02-25 DIAGNOSIS — Z66 Do not resuscitate: Secondary | ICD-10-CM | POA: Diagnosis present

## 2017-02-25 DIAGNOSIS — F0390 Unspecified dementia without behavioral disturbance: Secondary | ICD-10-CM | POA: Diagnosis present

## 2017-02-25 DIAGNOSIS — F05 Delirium due to known physiological condition: Secondary | ICD-10-CM | POA: Diagnosis not present

## 2017-02-25 DIAGNOSIS — S72001A Fracture of unspecified part of neck of right femur, initial encounter for closed fracture: Secondary | ICD-10-CM

## 2017-02-25 DIAGNOSIS — S72091A Other fracture of head and neck of right femur, initial encounter for closed fracture: Principal | ICD-10-CM | POA: Diagnosis present

## 2017-02-25 DIAGNOSIS — M542 Cervicalgia: Secondary | ICD-10-CM | POA: Diagnosis present

## 2017-02-25 DIAGNOSIS — R5383 Other fatigue: Secondary | ICD-10-CM | POA: Diagnosis present

## 2017-02-25 DIAGNOSIS — S72009A Fracture of unspecified part of neck of unspecified femur, initial encounter for closed fracture: Secondary | ICD-10-CM

## 2017-02-25 DIAGNOSIS — Z79899 Other long term (current) drug therapy: Secondary | ICD-10-CM | POA: Diagnosis not present

## 2017-02-25 DIAGNOSIS — E039 Hypothyroidism, unspecified: Secondary | ICD-10-CM | POA: Diagnosis present

## 2017-02-25 DIAGNOSIS — I10 Essential (primary) hypertension: Secondary | ICD-10-CM | POA: Diagnosis present

## 2017-02-25 DIAGNOSIS — M62838 Other muscle spasm: Secondary | ICD-10-CM | POA: Diagnosis present

## 2017-02-25 DIAGNOSIS — F03B0 Unspecified dementia, moderate, without behavioral disturbance, psychotic disturbance, mood disturbance, and anxiety: Secondary | ICD-10-CM

## 2017-02-25 HISTORY — PX: HIP ARTHROPLASTY: SHX981

## 2017-02-25 HISTORY — DX: Unspecified atrial fibrillation: I48.91

## 2017-02-25 LAB — TROPONIN I: Troponin I: <0.03 ng/mL (ref <0.03)

## 2017-02-25 LAB — URINALYSIS, COMPLETE (UACMP) WITH MICROSCOPIC
BACTERIA UA: NONE SEEN
BILIRUBIN URINE: NEGATIVE
Glucose, UA: NEGATIVE mg/dL
HGB URINE DIPSTICK: NEGATIVE
Ketones, ur: NEGATIVE mg/dL
NITRITE: NEGATIVE
PH: 7 (ref 5.0–8.0)
Protein, ur: NEGATIVE mg/dL
SPECIFIC GRAVITY, URINE: 1.013 (ref 1.005–1.030)

## 2017-02-25 LAB — CBC
HEMATOCRIT: 38.9 % (ref 35.0–47.0)
HEMOGLOBIN: 13.3 g/dL (ref 12.0–16.0)
MCH: 39.4 pg — AB (ref 26.0–34.0)
MCHC: 34.3 g/dL (ref 32.0–36.0)
MCV: 114.9 fL — AB (ref 80.0–100.0)
Platelets: 197 10*3/uL (ref 150–440)
RBC: 3.38 MIL/uL — AB (ref 3.80–5.20)
RDW: 13.9 % (ref 11.5–14.5)
WBC: 12.5 10*3/uL — AB (ref 3.6–11.0)

## 2017-02-25 LAB — SURGICAL PCR SCREEN
MRSA, PCR: NEGATIVE
STAPHYLOCOCCUS AUREUS: NEGATIVE

## 2017-02-25 LAB — BASIC METABOLIC PANEL
Anion gap: 8 (ref 5–15)
BUN: 12 mg/dL (ref 6–20)
CALCIUM: 9.3 mg/dL (ref 8.9–10.3)
CO2: 25 mmol/L (ref 22–32)
Chloride: 104 mmol/L (ref 101–111)
Creatinine, Ser: 0.72 mg/dL (ref 0.44–1.00)
GFR calc Af Amer: >60 mL/min (ref >60)
GFR calc non Af Amer: >60 mL/min (ref >60)
GLUCOSE: 112 mg/dL — AB (ref 65–99)
POTASSIUM: 3.7 mmol/L (ref 3.5–5.1)
Sodium: 137 mmol/L (ref 135–145)

## 2017-02-25 LAB — PROTIME-INR
INR: 1
PROTHROMBIN TIME: 13.1 s (ref 11.4–15.2)

## 2017-02-25 LAB — APTT: aPTT: 30 seconds (ref 24–36)

## 2017-02-25 SURGERY — HEMIARTHROPLASTY, HIP, DIRECT ANTERIOR APPROACH, FOR FRACTURE
Anesthesia: General | Laterality: Right

## 2017-02-25 MED ORDER — LEVOTHYROXINE SODIUM 50 MCG PO TABS
50.0000 ug | ORAL_TABLET | ORAL | Status: DC
  Administered 2017-02-26 – 2017-02-27 (×2): 50 ug via ORAL
  Filled 2017-02-25 (×3): qty 1

## 2017-02-25 MED ORDER — METHOCARBAMOL 1000 MG/10ML IJ SOLN
500.0000 mg | Freq: Four times a day (QID) | INTRAMUSCULAR | Status: DC | PRN
  Filled 2017-02-25: qty 5

## 2017-02-25 MED ORDER — MORPHINE SULFATE (PF) 2 MG/ML IV SOLN
INTRAVENOUS | Status: AC
  Administered 2017-02-25: 2 mg via INTRAVENOUS
  Filled 2017-02-25: qty 1

## 2017-02-25 MED ORDER — METHOCARBAMOL 500 MG PO TABS: 500.0000 mg | ORAL_TABLET | Freq: Four times a day (QID) | ORAL | Status: DC | PRN

## 2017-02-25 MED ORDER — NEOMYCIN-POLYMYXIN B GU 40-200000 IR SOLN
Status: DC | PRN
  Administered 2017-02-25: 16 mL

## 2017-02-25 MED ORDER — MORPHINE SULFATE (PF) 4 MG/ML IV SOLN
INTRAVENOUS | Status: AC
  Filled 2017-02-25: qty 1

## 2017-02-25 MED ORDER — CLINDAMYCIN PHOSPHATE 600 MG/50ML IV SOLN
600.0000 mg | Freq: Four times a day (QID) | INTRAVENOUS | Status: AC
  Administered 2017-02-25: 600 mg via INTRAVENOUS
  Filled 2017-02-25 (×2): qty 50

## 2017-02-25 MED ORDER — POLYETHYLENE GLYCOL 3350 17 G PO PACK: 17.0000 g | PACK | Freq: Every day | ORAL | Status: DC | PRN

## 2017-02-25 MED ORDER — SENNOSIDES-DOCUSATE SODIUM 8.6-50 MG PO TABS: 1.0000 | ORAL_TABLET | Freq: Every evening | ORAL | Status: DC | PRN

## 2017-02-25 MED ORDER — PROPOFOL 10 MG/ML IV BOLUS
INTRAVENOUS | Status: DC | PRN
  Administered 2017-02-25: 100 mg via INTRAVENOUS
  Administered 2017-02-25: 40 mg via INTRAVENOUS

## 2017-02-25 MED ORDER — DOCUSATE SODIUM 100 MG PO CAPS: 100.0000 mg | ORAL_CAPSULE | ORAL | Status: DC

## 2017-02-25 MED ORDER — BISACODYL 10 MG RE SUPP: 10.0000 mg | Freq: Every day | RECTAL | Status: DC | PRN

## 2017-02-25 MED ORDER — MORPHINE SULFATE (PF) 2 MG/ML IV SOLN
2.0000 mg | INTRAVENOUS | Status: DC | PRN
Start: 2017-02-25 — End: 2017-02-25
  Administered 2017-02-25: 2 mg via INTRAVENOUS
  Filled 2017-02-25: qty 1

## 2017-02-25 MED ORDER — MORPHINE SULFATE (PF) 2 MG/ML IV SOLN: 2.0000 mg | INTRAVENOUS | Status: DC | PRN

## 2017-02-25 MED ORDER — NEOMYCIN-POLYMYXIN B GU 40-200000 IR SOLN
Status: AC
  Filled 2017-02-25: qty 20

## 2017-02-25 MED ORDER — MENTHOL 3 MG MT LOZG
1.0000 | LOZENGE | OROMUCOSAL | Status: DC | PRN
  Filled 2017-02-25: qty 9

## 2017-02-25 MED ORDER — SUGAMMADEX SODIUM 500 MG/5ML IV SOLN
INTRAVENOUS | Status: DC | PRN
  Administered 2017-02-25: 200 mg via INTRAVENOUS

## 2017-02-25 MED ORDER — CLINDAMYCIN PHOSPHATE 600 MG/50ML IV SOLN
600.0000 mg | Freq: Once | INTRAVENOUS | Status: AC
  Administered 2017-02-25: 600 mg via INTRAVENOUS
  Filled 2017-02-25: qty 50

## 2017-02-25 MED ORDER — THERA-DERM EX LOTN
1.0000 "application " | TOPICAL_LOTION | CUTANEOUS | Status: DC
  Filled 2017-02-25: qty 236

## 2017-02-25 MED ORDER — ENOXAPARIN SODIUM 40 MG/0.4ML ~~LOC~~ SOLN
40.0000 mg | SUBCUTANEOUS | Status: DC
  Administered 2017-02-26 – 2017-02-28 (×3): 40 mg via SUBCUTANEOUS
  Filled 2017-02-25 (×3): qty 0.4

## 2017-02-25 MED ORDER — MAGNESIUM CITRATE PO SOLN
1.0000 | Freq: Once | ORAL | Status: DC | PRN
  Filled 2017-02-25: qty 296

## 2017-02-25 MED ORDER — POLYVINYL ALCOHOL 1.4 % OP SOLN
1.0000 [drp] | Freq: Three times a day (TID) | OPHTHALMIC | Status: DC
  Administered 2017-02-26 – 2017-02-28 (×7): 1 [drp] via OPHTHALMIC
  Filled 2017-02-25: qty 15

## 2017-02-25 MED ORDER — RISPERIDONE 1 MG PO TABS
1.0000 mg | ORAL_TABLET | Freq: Every day | ORAL | Status: DC
  Administered 2017-02-27: 1 mg via ORAL
  Filled 2017-02-25 (×4): qty 1

## 2017-02-25 MED ORDER — FUROSEMIDE 20 MG PO TABS: 20.0000 mg | ORAL_TABLET | Freq: Every day | ORAL | Status: DC

## 2017-02-25 MED ORDER — ONDANSETRON HCL 4 MG/2ML IJ SOLN
INTRAMUSCULAR | Status: AC
  Administered 2017-02-25: 4 mg via INTRAVENOUS
  Filled 2017-02-25: qty 2

## 2017-02-25 MED ORDER — SENNA 8.6 MG PO TABS
1.0000 | ORAL_TABLET | Freq: Two times a day (BID) | ORAL | Status: DC
  Administered 2017-02-26 – 2017-02-27 (×2): 8.6 mg via ORAL
  Filled 2017-02-25 (×4): qty 1

## 2017-02-25 MED ORDER — MORPHINE SULFATE (PF) 2 MG/ML IV SOLN
2.0000 mg | Freq: Once | INTRAVENOUS | Status: AC
Start: 2017-02-25 — End: 2017-02-25
  Administered 2017-02-25: 2 mg via INTRAVENOUS

## 2017-02-25 MED ORDER — ONDANSETRON HCL 4 MG/2ML IJ SOLN: 4.0000 mg | Freq: Once | INTRAMUSCULAR | Status: DC | PRN

## 2017-02-25 MED ORDER — ONDANSETRON HCL 4 MG/2ML IJ SOLN: 4.0000 mg | Freq: Four times a day (QID) | INTRAMUSCULAR | Status: DC | PRN

## 2017-02-25 MED ORDER — ACETAMINOPHEN 500 MG PO TABS
500.0000 mg | ORAL_TABLET | Freq: Four times a day (QID) | ORAL | Status: DC | PRN
  Administered 2017-02-26 – 2017-02-28 (×2): 500 mg via ORAL
  Filled 2017-02-25 (×4): qty 1

## 2017-02-25 MED ORDER — FENTANYL CITRATE (PF) 100 MCG/2ML IJ SOLN
INTRAMUSCULAR | Status: AC
  Filled 2017-02-25: qty 2

## 2017-02-25 MED ORDER — PHENOL 1.4 % MT LIQD
1.0000 | OROMUCOSAL | Status: DC | PRN
  Filled 2017-02-25: qty 177

## 2017-02-25 MED ORDER — SODIUM CHLORIDE 0.9 % IV BOLUS (SEPSIS)
1000.0000 mL | Freq: Once | INTRAVENOUS | Status: AC
  Administered 2017-02-25: 1000 mL via INTRAVENOUS

## 2017-02-25 MED ORDER — PHENYLEPHRINE HCL 10 MG/ML IJ SOLN
INTRAMUSCULAR | Status: DC | PRN
  Administered 2017-02-25 (×2): 50 ug via INTRAVENOUS

## 2017-02-25 MED ORDER — ONDANSETRON HCL 4 MG/2ML IJ SOLN
4.0000 mg | Freq: Once | INTRAMUSCULAR | Status: AC
  Administered 2017-02-25: 4 mg via INTRAVENOUS

## 2017-02-25 MED ORDER — LOSARTAN POTASSIUM 50 MG PO TABS: 50.0000 mg | ORAL_TABLET | Freq: Every day | ORAL | Status: DC

## 2017-02-25 MED ORDER — SODIUM CHLORIDE 0.9 % IV SOLN
INTRAVENOUS | Status: DC | PRN
  Administered 2017-02-25 (×2): via INTRAVENOUS

## 2017-02-25 MED ORDER — GUAIFENESIN 100 MG/5ML PO SYRP
100.0000 mg | ORAL_SOLUTION | Freq: Four times a day (QID) | ORAL | Status: DC | PRN
  Filled 2017-02-25: qty 5

## 2017-02-25 MED ORDER — ONDANSETRON HCL 4 MG PO TABS: 4.0000 mg | ORAL_TABLET | Freq: Four times a day (QID) | ORAL | Status: DC | PRN

## 2017-02-25 MED ORDER — MORPHINE SULFATE (PF) 4 MG/ML IV SOLN
4.0000 mg | Freq: Once | INTRAVENOUS | Status: AC
  Administered 2017-02-25: 4 mg via INTRAVENOUS

## 2017-02-25 MED ORDER — ALUM & MAG HYDROXIDE-SIMETH 200-200-20 MG/5ML PO SUSP: 30.0000 mL | ORAL | Status: DC | PRN

## 2017-02-25 MED ORDER — SUCCINYLCHOLINE CHLORIDE 20 MG/ML IJ SOLN
INTRAMUSCULAR | Status: DC | PRN
  Administered 2017-02-25: 80 mg via INTRAVENOUS

## 2017-02-25 MED ORDER — SODIUM CHLORIDE 0.9 % IV SOLN
75.0000 mL/h | INTRAVENOUS | Status: DC
  Administered 2017-02-25 – 2017-03-01 (×6): 75 mL/h via INTRAVENOUS

## 2017-02-25 MED ORDER — ATORVASTATIN CALCIUM 20 MG PO TABS
40.0000 mg | ORAL_TABLET | Freq: Every day | ORAL | Status: DC
  Administered 2017-02-26: 40 mg via ORAL
  Filled 2017-02-25: qty 2

## 2017-02-25 MED ORDER — HYDROCODONE-ACETAMINOPHEN 5-325 MG PO TABS
1.0000 | ORAL_TABLET | Freq: Four times a day (QID) | ORAL | Status: DC | PRN
  Administered 2017-02-26 (×2): 2 via ORAL
  Filled 2017-02-25 (×2): qty 2

## 2017-02-25 MED ORDER — POTASSIUM CHLORIDE CRYS ER 10 MEQ PO TBCR
10.0000 meq | EXTENDED_RELEASE_TABLET | Freq: Every day | ORAL | Status: DC
  Administered 2017-02-26 – 2017-02-27 (×2): 10 meq via ORAL
  Filled 2017-02-25 (×3): qty 1

## 2017-02-25 MED ORDER — METOPROLOL TARTRATE 25 MG PO TABS
25.0000 mg | ORAL_TABLET | Freq: Two times a day (BID) | ORAL | Status: DC
  Administered 2017-02-25 – 2017-02-28 (×5): 25 mg via ORAL
  Filled 2017-02-25 (×6): qty 1

## 2017-02-25 MED ORDER — FENTANYL CITRATE (PF) 100 MCG/2ML IJ SOLN
25.0000 ug | INTRAMUSCULAR | Status: DC | PRN
  Administered 2017-02-25: 25 ug via INTRAVENOUS

## 2017-02-25 MED ORDER — PROPOFOL 500 MG/50ML IV EMUL
INTRAVENOUS | Status: AC
  Filled 2017-02-25: qty 50

## 2017-02-25 SURGICAL SUPPLY — 54 items
BLADE SAGITTAL WIDE XTHICK NO (BLADE) ×3 IMPLANT
BLADE SURG SZ10 CARB STEEL (BLADE) ×3 IMPLANT
BNDG COHESIVE 4X5 TAN STRL (GAUZE/BANDAGES/DRESSINGS) ×3 IMPLANT
CANISTER SUCT 1200ML W/VALVE (MISCELLANEOUS) ×3 IMPLANT
CANISTER SUCT 3000ML PPV (MISCELLANEOUS) ×6 IMPLANT
CAPT HIP HEMI 1 ×3 IMPLANT
DRAPE IMP U-DRAPE 54X76 (DRAPES) ×3 IMPLANT
DRAPE INCISE IOBAN 66X60 STRL (DRAPES) ×3 IMPLANT
DRAPE SHEET LG 3/4 BI-LAMINATE (DRAPES) ×6 IMPLANT
DRAPE SURG 17X11 SM STRL (DRAPES) ×3 IMPLANT
DRAPE TABLE BACK 80X90 (DRAPES) ×3 IMPLANT
DRSG OPSITE POSTOP 4X10 (GAUZE/BANDAGES/DRESSINGS) ×3 IMPLANT
DURAPREP 26ML APPLICATOR (WOUND CARE) ×10 IMPLANT
ELECT BLADE 6.5 EXT (BLADE) ×3 IMPLANT
ELECT CAUTERY BLADE 6.4 (BLADE) ×3 IMPLANT
ELECT REM PT RETURN 9FT ADLT (ELECTROSURGICAL) ×3
ELECTRODE REM PT RTRN 9FT ADLT (ELECTROSURGICAL) ×1 IMPLANT
GAUZE PETRO XEROFOAM 1X8 (MISCELLANEOUS) ×6 IMPLANT
GAUZE SPONGE 4X4 12PLY STRL (GAUZE/BANDAGES/DRESSINGS) ×3 IMPLANT
GLOVE BIOGEL PI IND STRL 9 (GLOVE) ×1 IMPLANT
GLOVE BIOGEL PI INDICATOR 9 (GLOVE) ×2
GLOVE SURG 9.0 ORTHO LTXF (GLOVE) ×6 IMPLANT
GOWN STRL REUS TWL 2XL XL LVL4 (GOWN DISPOSABLE) ×3 IMPLANT
GOWN STRL REUS W/ TWL LRG LVL3 (GOWN DISPOSABLE) ×1 IMPLANT
GOWN STRL REUS W/TWL LRG LVL3 (GOWN DISPOSABLE) ×3
HEMOVAC 400ML (MISCELLANEOUS)
HIP CAPITATED HEMI 1 IMPLANT
KIT DRAIN HEMOVAC JP 7FR 400ML (MISCELLANEOUS) ×1 IMPLANT
KIT RM TURNOVER STRD PROC AR (KITS) ×3 IMPLANT
NDL FILTER BLUNT 18X1 1/2 (NEEDLE) ×1 IMPLANT
NDL MAYO CATGUT SZ4 TPR NDL (NEEDLE) ×1 IMPLANT
NDL SAFETY 18GX1.5 (NEEDLE) ×3 IMPLANT
NEEDLE FILTER BLUNT 18X 1/2SAF (NEEDLE) ×2
NEEDLE FILTER BLUNT 18X1 1/2 (NEEDLE) ×1 IMPLANT
NEEDLE MAYO CATGUT SZ4 (NEEDLE) ×3 IMPLANT
NS IRRIG 1000ML POUR BTL (IV SOLUTION) ×3 IMPLANT
PACK HIP PROSTHESIS (MISCELLANEOUS) ×3 IMPLANT
PILLOW ABDUC SM (MISCELLANEOUS) ×3 IMPLANT
PILLOW ABDUCTION HIP (SOFTGOODS) ×2 IMPLANT
PULSAVAC PLUS IRRIG FAN TIP (DISPOSABLE) ×3
RETRIEVER SUT HEWSON (MISCELLANEOUS) ×3 IMPLANT
SOL .9 NS 3000ML IRR  AL (IV SOLUTION) ×2
SOL .9 NS 3000ML IRR AL (IV SOLUTION) ×1
SOL .9 NS 3000ML IRR UROMATIC (IV SOLUTION) ×1 IMPLANT
STAPLER SKIN PROX 35W (STAPLE) ×3 IMPLANT
SUT TICRON 2-0 30IN 311381 (SUTURE) ×12 IMPLANT
SUT VIC AB 0 CT1 36 (SUTURE) ×3 IMPLANT
SUT VIC AB 2-0 CT2 27 (SUTURE) ×6 IMPLANT
SYRINGE 10CC LL (SYRINGE) ×3 IMPLANT
TAPE MICROFOAM 4IN (TAPE) ×1 IMPLANT
TAPE TRANSPORE STRL 2 31045 (GAUZE/BANDAGES/DRESSINGS) ×3 IMPLANT
TIP BRUSH PULSAVAC PLUS 24.33 (MISCELLANEOUS) ×3 IMPLANT
TIP FAN IRRIG PULSAVAC PLUS (DISPOSABLE) ×1 IMPLANT
TRAY FOLEY W/METER SILVER 16FR (SET/KITS/TRAYS/PACK) ×2 IMPLANT

## 2017-02-25 NOTE — ED Notes (Signed)
Pt resting quietly on stretcher, occasionally will groan out in pain. 2 Family members at the bedside.

## 2017-02-25 NOTE — H&P (Signed)
[facility] [place] at [facility]   PATIENT NAME: Casey Sanford    MR#:  [id number]  DATE OF BIRTH:  November 20, 1922  DATE OF ADMISSION:  02/25/2017  PRIMARY CARE PHYSICIAN: Jaclyn Shaggy, MD   REQUESTING/REFERRING PHYSICIAN: Dionne Bucy, MD  CHIEF COMPLAINT:  Fall  HISTORY OF PRESENT ILLNESS:  Casey Sanford  is a 83 y.o. female with a known history of Chronic atrial fibrillation, dementia, hypertension, hypothyroidism and other medical problem is brought into the ED from [facility] nursing facility after she sustained a witnessed fall. Patient was in severe right hip pain and x-ray has revealed right femoral neck fracture. Hospitalist team is called to admit the patient. On call orthopedic surgeon Dr. Martha Clan was notified  PAST MEDICAL HISTORY:   Past Medical History:  Diagnosis Date  . A-fib (HCC)   . Atrial fibrillation (HCC) 2013   per family  . Dementia   . Hypertension   . Thyroid disease   . TIA (transient ischemic attack)     PAST SURGICAL HISTOIRY:   Past Surgical History:  Procedure Laterality Date  . CHOLECYSTECTOMY    . PERIPHERAL VASCULAR CATHETERIZATION Right 06/09/2015   Procedure: Lower Extremity Angiography;  Surgeon: Renford Dills, MD;  Location: [facility] INVASIVE CV LAB;  Service: Cardiovascular;  Laterality: Right;  . PERIPHERAL VASCULAR CATHETERIZATION Right 06/09/2015   Procedure: Lower Extremity Intervention;  Surgeon: Renford Dills, MD;  Location: [facility] INVASIVE CV LAB;  Service: Cardiovascular;  Laterality: Right;    SOCIAL HISTORY:   Social History  Substance Use Topics  . Smoking status: Never Smoker  . Smokeless tobacco: Never Used  . Alcohol use No     Comment: nightly    FAMILY HISTORY:   Family History  Problem Relation Age of Onset  . Hypertension Mother     DRUG ALLERGIES:   Allergies  Allergen Reactions  . Sulfamethoxazole-Trimethoprim Rash  . Cephalexin Itching    REVIEW OF SYSTEMS:   Review of systems unobtainable from chronic dementia   MEDICATIONS AT HOME:   Prior to Admission medications   Medication Sig Start Date End Date Taking? Authorizing Provider  acetaminophen (TYLENOL) 500 MG tablet Take 500 mg by mouth every 6 (six) hours as needed (hip pain).   Yes [provider]  Emollient (AVEENO DAILY MOISTURIZING) LOTN Apply 1 application topically every Monday, Wednesday, and Friday at 8 PM. 06/26/16  Yes [provider]  levothyroxine (SYNTHROID, LEVOTHROID) 50 MCG tablet Take 1 tablet by mouth daily. 01/21/15  Yes [provider]  Propylene Glycol (SYSTANE BALANCE) 0.6 % SOLN Place 1 drop into the right eye 3 (three) times daily. For lubricant. Wait 3-5 minutes between different drops   Yes [provider]  risperiDONE (RISPERDAL) 1 MG tablet Take 1 tablet by mouth at bedtime.  11/22/16  Yes [provider]  senna-docusate (SENOKOT-S) 8.6-50 MG tablet Take 1 tablet by mouth at bedtime as needed for mild constipation. 07/23/16  Yes Katharina Caper, MD  apixaban (ELIQUIS) 5 MG TABS tablet Take 1 tablet (5 mg total) by mouth 2 (two) times daily. Patient not taking: Reported on 02/25/2017 07/23/16   Katharina Caper, MD  aspirin 81 MG chewable tablet Chew by mouth daily.    [provider]  atorvastatin (LIPITOR) 40 MG tablet Take 1 tablet (40 mg total) by mouth daily at 6 PM. Patient not taking: Reported on 02/25/2017 07/23/16   Katharina Caper, MD  docusate sodium (COLACE) 100 MG capsule Take  100 mg by mouth every other day.     [provider]  furosemide (LASIX) 20 MG tablet Take 1 tablet by mouth daily. 01/14/15   [provider]  guaifenesin (ROBITUSSIN) 100 MG/5ML syrup Take 100 mg by mouth every 6 (six) hours as needed for cough.    [provider]  hydrocortisone cream 1 % Apply 1 application topically every 12 (twelve) hours as needed for itching.    [provider]  losartan (COZAAR) 50 MG  tablet Take 1 tablet by mouth daily. 01/26/15   [provider]  metoprolol tartrate (LOPRESSOR) 25 MG tablet Take 1 tablet (25 mg total) by mouth 2 (two) times daily. Patient not taking: Reported on 02/25/2017 07/23/16   Katharina Caper, MD  Potassium Chloride CR (MICRO-K) 8 MEQ CPCR capsule CR Take 1 capsule by mouth daily. 01/14/15   [provider]      VITAL SIGNS:  Blood pressure (!) 173/97, pulse (!) 103, temperature 97.8 F (36.6 C), resp. rate 16, SpO2 100 %.  PHYSICAL EXAMINATION:  GENERAL:  83 y.o.-year-old patient lying in the bed with no acute distress.  EYES: Pupils equal, round, reactive to light and accommodation. No scleral icterus.  HEENT: Head atraumatic, normocephalic. Oropharynx and nasopharynx clear.  NECK:  Supple, no jugular venous distention. No thyroid enlargement, no tenderness.  LUNGS: Normal breath sounds bilaterally, no wheezing, rales,rhonchi or crepitation. No use of accessory muscles of respiration.  CARDIOVASCULAR: S1, S2 normal. No murmurs, rubs, or gallops.  ABDOMEN: Soft, nontender, nondistended. Bowel sounds present. No organomegaly or mass.  EXTREMITIES:Right hip area is tender. No pedal edema, cyanosis, or clubbing.  NEUROLOGIC: Patient has chronic dementia  PSYCHIATRIC: The patient is demented  SKIN: No obvious rash, lesion, or ulcer.   LABORATORY PANEL:   CBC  Recent Labs Lab 02/25/17 0645  WBC 12.5*  HGB 13.3  HCT 38.9  PLT 197   ------------------------------------------------------------------------------------------------------------------  Chemistries   Recent Labs Lab 02/25/17 0645  NA 137  K 3.7  CL 104  CO2 25  GLUCOSE 112*  BUN 12  CREATININE 0.72  CALCIUM 9.3   ------------------------------------------------------------------------------------------------------------------  Cardiac Enzymes  Recent Labs Lab 02/25/17 0645  TROPONINI <0.03    ------------------------------------------------------------------------------------------------------------------  RADIOLOGY:  Ct Head Wo Contrast  Result Date: 02/25/2017 CLINICAL DATA:  Unwitnessed fall.  History of dementia. EXAM: CT HEAD WITHOUT CONTRAST TECHNIQUE: Contiguous axial images were obtained from the base of the skull through the vertex without intravenous contrast. COMPARISON:  Head CT dated 07/21/2016 and brain MRI dated 07/22/2016 FINDINGS: Brain: There is mild generalized age-related parenchymal atrophy with commensurate dilatation of the ventricles and sulci. Chronic small vessel ischemic changes again noted within the bilateral periventricular white matter. Small old lacunar infarct noted in the left basal ganglia. Old infarct within the left occipital lobe with associated encephalomalacia. There is no mass, hemorrhage, edema or other evidence of acute parenchymal abnormality. No extra-axial hemorrhage. Vascular: There are chronic calcified atherosclerotic changes of the large vessels at the skull base. No unexpected hyperdense vessel. Skull: Normal. Negative for fracture or focal lesion. Sinuses/Orbits: No acute finding. Other: None. IMPRESSION: 1. No acute findings. No intracranial mass, hemorrhage or edema. No skull fracture. 2. Chronic ischemic changes, as detailed above. Electronically Signed   By: Bary Richard M.D.   On: 02/25/2017 07:34   Dg Chest Port 1 View  Result Date: 02/25/2017 CLINICAL DATA:  Unwitnessed fall. EXAM: PORTABLE CHEST 1 VIEW COMPARISON:  Radiograph of February 05, 2015. FINDINGS: Stable  cardiomediastinal silhouette. Atherosclerosis of thoracic aorta is noted. No pneumothorax or pleural effusion is noted. Right lung is clear. Minimal left basilar subsegmental atelectasis or scarring is noted. Bony thorax is unremarkable. IMPRESSION: Aortic atherosclerosis. Minimal left basilar subsegmental atelectasis or scarring. Electronically Signed   By: Lupita RaiderJames  Green Jr,  M.D.   On: 02/25/2017 07:22   Dg Hip Unilat W Or Wo Pelvis 2-3 Views Right  Result Date: 02/25/2017 CLINICAL DATA:  Right hip pain after fall. EXAM: DG HIP (WITH OR WITHOUT PELVIS) 2-3V RIGHT COMPARISON:  None. FINDINGS: Moderately displaced fracture is seen involving the proximal right femoral neck. Left hip appears normal. IMPRESSION: Moderately displaced proximal right femoral neck fracture. Electronically Signed   By: Lupita RaiderJames  Green Jr, M.D.   On: 02/25/2017 07:23    EKG:   Orders placed or performed during the hospital encounter of 02/25/17  . ED EKG  . ED EKG  . EKG 12-Lead  . EKG 12-Lead    IMPRESSION AND PLAN:   Casey Sanford  is a 83 y.o. female with a known history of Chronic atrial fibrillation, dementia, hypertension, hypothyroidism and other medical problem [facility] after she sustained a witnessed fall. Patient was in severe right hip pain and x-ray has revealed right femoral neck fracture.   # Right hip pain-acute secondary to right femoral neck fracture Admit to MedSurg unit Nothing by mouth Pain management as needed Consult orthopedics discussed with Dr. Martha ClanKrasinski Patient is wheelchair bound at at her baseline  #Chronic atrial fibrillation rate controlled On metoprolol Primary care physician has discontinued Eliquis as patient is at high risk for falls Hold aspirin  #Hypothyroidism continue Synthroid  #Essential hypertension continue metoprolol Losartan discontinued by primary care physician  #Hyperlipidemia continue Lipitor  #Chronic dementia  DVT prophylaxis with SCDs    All the records are reviewed and case discussed with ED provider. Management plans discussed with the patient, family and they are in agreement.  CODE STATUS: DNR  TOTAL TIME TAKING CARE OF THIS PATIENT: 45  minutes.   Note: This dictation was prepared with Dragon dictation along with smaller phrase technology. Any transcriptional errors  that result from this process are unintentional.  Ramonita LabGouru, Deondra Labrador M.D on 02/25/2017 at 12:28 PM  Between 7am to 6pm - Pager - [phone]  After 6pm go to [email or web address] - password [email or web address] [facility]  [facility]  Office  [phone]  CC: Primary care physician; Jaclyn Shaggyate, Denny C, MD

## 2017-02-25 NOTE — Anesthesia Postprocedure Evaluation (Signed)
Anesthesia Post Note  Patient: Casey Sanford  Procedure(s) Performed: Procedure(s) (LRB): ARTHROPLASTY BIPOLAR HIP (HEMIARTHROPLASTY) (Right)  Patient location during evaluation: PACU Anesthesia Type: General Level of consciousness: awake (demented at baseline) Pain management: pain level controlled Vital Signs Assessment: post-procedure vital signs reviewed and stable Respiratory status: spontaneous breathing, nonlabored ventilation, respiratory function stable and patient connected to nasal cannula oxygen Cardiovascular status: blood pressure returned to baseline and stable Postop Assessment: no signs of nausea or vomiting Anesthetic complications: no     Last Vitals:  Vitals:   02/25/17 1738 02/25/17 1801  BP: (!) 177/77 (!) 154/81  Pulse: 79 74  Resp:  18  Temp:    SpO2: 100% 100%    Last Pain:  Vitals:   02/25/17 1801  TempSrc:   PainSc: 0-No pain                 Lenard SimmerAndrew Micaella Gitto

## 2017-02-25 NOTE — ED Notes (Signed)
Daughter states patient was recently taken out of hospice care.  She did not elaborate on why at this time.

## 2017-02-25 NOTE — Op Note (Signed)
02/25/2017  4:22 PM  PATIENT:  Casey Sanford   MRN: [id number]  PRE-OPERATIVE DIAGNOSIS:  right femoral neck hip fracture  POST-OPERATIVE DIAGNOSIS:  same  PROCEDURE:  Procedure(s): RIGHT HIP HEMIARTHROPLASTY   PREOPERATIVE INDICATIONS:    Casey Sanford is an 83 y.o. female who was admitted with a diagnosis of right hip fracture.  I have recommended surgical fixation with hemiarthroplasty for this injury. I have explained the surgery and the postoperative course to the patient's daughters who have agreed with surgical management of this fracture.    The risks benefits and alternatives were discussed with the patient and their family including but not limited to the risks of  infection requiring removal of the prosthesis, bleeding requiring blood transfusion, nerve injury especially to the sciatic nerve leading to foot drop or lower extremity numbness, periprosthetic fracture, dislocation leg length discrepancy, change in lower extremity rotation persistent hip pain, loosening or failure of the components and the need for revision surgery. Medical risks include but are not limited to DVT and pulmonary embolism, myocardial infarction, stroke, pneumonia, respiratory failure and death.  OPERATIVE REPORT     SURGEON:  Thornton Park, MD    ANESTHESIA:  General    COMPLICATIONS:  None.   SPECIMEN: Femoral head to pathology    COMPONENTS:  [employer] Accolade HFX femoral component size 4 with a 46 mm -4 neck adjustment sleeve.    PROCEDURE IN DETAIL:   The patient was met in the holding area and  identified.  The appropriate hip was identified and marked at the operative site after verbally confirming with the patient that this was the correct site of surgery.  The patient was then transported to the OR  and underwent general anesthesia.  The patient was then placed in the lateral decubitus position with the operative side up and secured on the operating room table with a pegboard and all  bony prominences were adequately padded. This included an axillary roll and additional padding around the nonoperative leg to prevent compression to the common peroneal nerve.    The operative lower extremity was prepped and draped in a sterile fashion.  A time out was performed prior to incision to verify patient's name, date of birth, medical record number, correct site of surgery correct procedure to be performed. The timeout was also used to verify the patient received antibiotics now appropriate instruments, implants and radiographic studies were available in the room. Once all in attendance were in agreement case began.    A posterolateral approach was utilized via sharp dissection  carried down to the subcutaneous tissue.  Bleeding vessels were coagulated using electrocautery.  The fascia lata was identified and incised along the length of the skin incision.  The gluteus maximus muscle was then split in line with its fibers. Self-retaining retractors were  inserted.  With the hip internally rotated, the short external rotators  were identified and removed from the posterior attachment from the greater trochanter. The piriformis was tagged for later repair. The capsule was identified and a T-shaped capsulotomy was performed. The capsule was tagged with #2 Tycron for later repair.  The femoral neck fracture was exposed, and the femoral head was removed using a corkscrew device. This was measured to be 46 mm in diameter. The attention was then turned to proximal femur preparation.  An oscillating saw was used to perform a proximal femoral osteotomy 1 fingerbreadth above the lesser trochanter. The trial 46 mm femoral head was placed into the acetabulum  and had an excellent suction fit. The attention was then turned back to femoral preparation.    A femoral skid and Cobra retractor were placed under the femoral neck to allow for adequate visualization. A box osteotome was used to make the initial entry  into the proximal femur. A single hand reamer was used to prepare the femoral canal. A T-shaped femoral canal sounder was then used to ensure no penetration femoral cortex had occurred during reaming. The proximal femur was then sequentially broached by hand. A size 4 femoral trial broach was found to have best medial to lateral canal fit. Once adequate mediolateral canal fill was achieved the trial femoral broach, neck, and head was assembled and the hip was reduced. It was found to have excellent stability, equivalent leg lengths with functional range of motion. The trial components were then removed.  I copiously irrigated the femoral canal and then impacted the real femoral prosthesis into place into the appropriate version, slightly anteverted to the normal anatomy, and I impacted the actual 46 mm Unitrax femoral component with a -4 neck adjustment sleeve into place. The hip was then reduced and taken through functional range of motion and found to have excellent stability. Leg lengths were restored. The hip joint was copiously irrigated.   A soft tissue repair of the capsule and external rotators was performed using #2 Tycron through drill holes in the greater trochanter.  Excellent posterior capsular repair was achieved. The fascia lata was then closed with interrupted 0 Vicryl suture. The subcutaneous tissues were closed with 2-0 Vicryl and the skin approximated with staples.   The patient was then placed supine on the operative table. Leg lengths were checked clinically and found to be equivalent. An abduction pillow was placed between the lower extremities. The patient was then transferred to a hospital bed and brought to the PACU in stable condition. I was scrubbed and present the entire case and all sharp and instrument counts were correct at the conclusion of the case. I spoke with the patient's daughters in the postop consultation room to let them know the case was completed without complication  patient was stable in recovery room.     Timoteo Gaul, MD Orthopedic Surgeon

## 2017-02-25 NOTE — NC FL2 (Signed)
[place] MEDICAID FL2 LEVEL OF CARE SCREENING TOOL     IDENTIFICATION  Patient Name: Casey Sanford Birthdate: December 17, 1922 Sex: female Admission Date (Current Location): 02/25/2017  [facility] and [place] Number:  Chiropodist and Address:  [facility], [address] [place], [place] [address]      Provider Number: [id number]  Attending Physician Name and Address:  No att. providers found  Relative Name and Phone Number:       Current Level of Care: Hospital Recommended Level of Care: Assisted Living Facility Prior Approval Number:    Date Approved/Denied:   PASRR Number: [id number]  Discharge Plan: Domiciliary (Rest home)    Current Diagnoses: Patient Active Problem List   Diagnosis Date Noted  . Right sided weakness 07/23/2016  . Expressive aphasia 07/23/2016  . Hyperlipidemia 07/23/2016  . Atrial fibrillation (HCC) 07/23/2016  . Generalized weakness 07/23/2016  . ICAO (internal carotid artery occlusion), right 07/23/2016  . Acute ischemic left PCA stroke (HCC) 07/23/2016  . CVA (cerebral vascular accident) (HCC) 07/21/2016  . Atherosclerosis of native arteries of the extremities with ulceration (HCC) 06/29/2016  . Chronic ulcer of right great toe (HCC) 06/29/2016  . Essential hypertension 06/29/2016  . Hypothyroidism 06/29/2016    Orientation RESPIRATION BLADDER Height & Weight     Self  Normal Incontinent Weight:   Height:     BEHAVIORAL SYMPTOMS/MOOD NEUROLOGICAL BOWEL NUTRITION STATUS      Incontinent Diet (Normal)  AMBULATORY STATUS COMMUNICATION OF NEEDS Skin   Limited Assist (uses wheel chair) Verbally Normal                       Personal Care Assistance Level of Assistance  Bathing, Feeding, Dressing, Total care Bathing Assistance: Limited assistance Feeding assistance: Independent Dressing Assistance: Limited assistance Total Care Assistance: Limited assistance   Functional Limitations Info  Sight,  Hearing, Speech Sight Info: Adequate Hearing Info: Adequate Speech Info: Adequate    SPECIAL CARE FACTORS FREQUENCY  PT (By licensed PT), OT (By licensed OT)     PT Frequency: x5 OT Frequency: x5            Contractures Contractures Info: Not present    Additional Factors Info  Allergies   Allergies Info: sulfamethoxanole, trimethopprim, cephalexin           Current Medications (02/25/2017):  This is the current hospital active medication list Current Facility-Administered Medications  Medication Dose Route Frequency Provider Last Rate Last Dose  . morphine 4 MG/ML injection            Current Outpatient Prescriptions  Medication Sig Dispense Refill  . acetaminophen (TYLENOL) 500 MG tablet Take 500 mg by mouth every 6 (six) hours as needed (hip pain).    . Emollient (AVEENO DAILY MOISTURIZING) LOTN Apply 1 application topically every Monday, Wednesday, and Friday at 8 PM.    . guaifenesin (ROBITUSSIN) 100 MG/5ML syrup Take 100 mg by mouth every 6 (six) hours as needed for cough.    . hydrocortisone cream 1 % Apply 1 application topically every 12 (twelve) hours as needed for itching.    . levothyroxine (SYNTHROID, LEVOTHROID) 50 MCG tablet Take 1 tablet by mouth daily.    Marland Kitchen Propylene Glycol (SYSTANE BALANCE) 0.6 % SOLN Place 1 drop into the right eye 3 (three) times daily. For lubricant. Wait 3-5 minutes between different drops    . risperiDONE (RISPERDAL) 1 MG tablet Take 1 tablet by mouth at bedtime.     Marland Kitchen  senna-docusate (SENOKOT-S) 8.6-50 MG tablet Take 1 tablet by mouth at bedtime as needed for mild constipation. 30 tablet 6  . apixaban (ELIQUIS) 5 MG TABS tablet Take 1 tablet (5 mg total) by mouth 2 (two) times daily. (Patient not taking: Reported on 02/25/2017) 60 tablet 6  . aspirin 81 MG chewable tablet Chew by mouth daily.    Marland Kitchen. atorvastatin (LIPITOR) 40 MG tablet Take 1 tablet (40 mg total) by mouth daily at 6 PM. (Patient not taking: Reported on 02/25/2017) 30 tablet 6   . docusate sodium (COLACE) 100 MG capsule Take 100 mg by mouth every other day.     . furosemide (LASIX) 20 MG tablet Take 1 tablet by mouth daily.    Marland Kitchen. losartan (COZAAR) 50 MG tablet Take 1 tablet by mouth daily.    . metoprolol tartrate (LOPRESSOR) 25 MG tablet Take 1 tablet (25 mg total) by mouth 2 (two) times daily. (Patient not taking: Reported on 02/25/2017) 60 tablet 6  . Potassium Chloride CR (MICRO-K) 8 MEQ CPCR capsule CR Take 1 capsule by mouth daily.       Discharge Medications: Please see discharge summary for a list of discharge medications.  Relevant Imaging Results:  Relevant Lab Results:   Additional Information SS# [id number]  Cheron SchaumannBandi, Claudine M, [place]

## 2017-02-25 NOTE — ED Triage Notes (Signed)
[place] with unw fall co rt side hip pain  Pt moaning in pain and unable to extend rt leg  EMS reports hx of chr rt hip pain  Pt has hx of dementia  BP@ 192/90 during triage

## 2017-02-25 NOTE — ED Provider Notes (Signed)
-----------------------------------------   8:14 AM on 02/25/2017 -----------------------------------------  I took over care of this patient from Dr. Manson PasseyBrown.  X-ray shows a right femoral neck fracture. CT head is negative. I consulted Dr. Martha ClanKrasinski from orthopedics. Will proceed with admission to hospitalist.  ED ECG REPORT I, Dionne BucySebastian Geddy [place], the attending physician, personally viewed and interpreted this ECG.  Date: 02/25/2017 EKG Time: 7:47 Rate: 85 Rhythm: normal sinus rhythm QRS Axis: normal Intervals: normal ST/T Wave abnormalities: Q wave, V1-V2; no acute ST/T changes Narrative Interpretation: no evidence of acute ischemia  ----------------------------------------- 8:32 AM on 02/25/2017 -----------------------------------------  Signed out to hospitalist.   Dionne BucySiadecki, Daven Montz, MD 02/25/17 [phone]

## 2017-02-25 NOTE — Progress Notes (Signed)
Pt arrived back to room from surgery. 2 new skin tears assessed that were not their prior to leaving the unit (right arm and right leg). Pt confused but resting in bed. VS stable. NS-75 ml/hr running. Honeycomb dressing intact. No complaints of pain at this time. Will continue to monitor.

## 2017-02-25 NOTE — Progress Notes (Signed)
Bear hugger applied at [employer]6:30

## 2017-02-25 NOTE — Progress Notes (Signed)
Paged MD on call to notify of pt BP of 87/54. Willis MD responded and placed order for 1L NS bolus and recheck aBP after bolus. Will cont to monitor

## 2017-02-25 NOTE — Clinical Social Work Note (Signed)
Clinical Social Work Assessment  Patient Details  Name: Casey BakerHazel H Sanford MRN: [id number] Date of Birth: 04/29/1923  Date of referral:  02/25/17               Reason for consult:  Other (Comment Required) ([facility])                Permission sought to share information with:  Family Supports, Oceanographeracility Contact Representative Permission granted to share information::  Yes, Verbal Permission Granted  Name::     Gearldine BienenstockCarol Hyndshaw HCPOA [phone]  [facility]  Relationship::     Contact Information:     Housing/Transportation Living arrangements for the past 2 months:  [facility] Source of Information:  Adult Children Patient Interpreter Needed:  None Criminal Activity/Legal Involvement Pertinent to Current Situation/Hospitalization:  No - Comment as needed Significant Relationships:  Adult Children, Other Family Members Lives with:  Facility Resident Do you feel safe going back to the place where you live?  Yes Need for family participation in patient care:  Yes (Comment)  Care giving concerns: patients daughter concerned as her mother yells out in pain, daughter is aware pt to be admitted   Social Worker assessment / plan:  LCSW introduced myself to patient and daughter Jeanie SewerCarol Hyndshaw [phone] [facility] verbal consent given to collect information to complete assessment and Fl2 to be started. Patient is 83 year old female who is complaining of hip pain. [facility]. Its the family wish the patient is to return there once discharged. Patient uses a wheel chair, needs assistance with bathing ,dressing and can feed herself independently. She is oriented x1 self and family and is diagnosed with dementia. Patient was continually yelling out in pain. Patient and family is awaiting ortho doctor to discuss treatment plan. LCSW provided daughter with coffee and reviewed caregiver stress and discussed  ways for self care. LCSW comppleted assessment and Started Fl2  No further needs  Employment status:  Retired Health and safety inspectornsurance information:  Engineer, building servicesMedicare (AARP) PT Recommendations:  Not assessed at this time Information / Referral to community resources:   (None)  Patient/Family's Response to care: Family understands current medical issues and admission  Patient/Family's Understanding of and Emotional Response to Diagnosis, Current Treatment, and Prognosis:  HCPOA has good understanding of health situation  Emotional Assessment Appearance:  Appears stated age Attitude/Demeanor/Rapport:  Complaining, Crying Affect (typically observed):  Unable to Assess Orientation:  Oriented to Self Alcohol / Substance use:  Not Applicable Psych involvement (Current and /or in the community):  No (Comment)  Discharge Needs  Concerns to be addressed:  No discharge needs identified Readmission within the last 30 days:  No Current discharge risk:  None Barriers to Discharge:  Continued Medical Work up   [place], Green Camplaudine M, LCSW 02/25/2017, 8:27 AM

## 2017-02-25 NOTE — Anesthesia Preprocedure Evaluation (Signed)
Anesthesia Evaluation  Patient identified by MRN, date of birth, ID band Patient awake    Reviewed: Allergy & Precautions, H&P , NPO status , Patient's Chart, lab work & pertinent test results, reviewed documented beta blocker date and time   History of Anesthesia Complications (+) PROLONGED EMERGENCE and history of anesthetic complications  Airway   TM Distance: >3 FB Neck ROM: full   Comment: Not cooperative Dental  (+) Caps, Dental Advidsory Given   Pulmonary neg pulmonary ROS,           Cardiovascular Exercise Tolerance: Good hypertension, (-) angina+ Peripheral Vascular Disease  (-) CAD, (-) Past MI, (-) Cardiac Stents and (-) CABG + dysrhythmias Atrial Fibrillation (-) Valvular Problems/Murmurs     Neuro/Psych neg Seizures PSYCHIATRIC DISORDERS (Dementia) TIA   GI/Hepatic negative GI ROS, Neg liver ROS,   Endo/Other  neg diabetesHypothyroidism   Renal/GU negative Renal ROS  negative genitourinary   Musculoskeletal   Abdominal   Peds  Hematology negative hematology ROS (+)   Anesthesia Other Findings Past Medical History: No date: A-fib [facility](HCC) 2013: Atrial fibrillation (HCC)     Comment:  per family No date: Dementia No date: Hypertension No date: Thyroid disease No date: TIA (transient ischemic attack)   Reproductive/Obstetrics negative OB ROS                             Anesthesia Physical Anesthesia Plan  ASA: III  Anesthesia Plan: General   Post-op Pain Management:    Induction:   PONV Risk Score and Plan: 3 and Ondansetron and Dexamethasone  Airway Management Planned:   Additional Equipment:   Intra-op Plan:   Post-operative Plan:   Informed Consent: I have reviewed the patients History and Physical, chart, labs and discussed the procedure including the risks, benefits and alternatives for the proposed anesthesia with the patient or authorized representative  who has indicated his/her understanding and acceptance.   Dental Advisory Given  Plan Discussed with: Anesthesiologist, CRNA and Surgeon  Anesthesia Plan Comments:         Anesthesia Quick Evaluation

## 2017-02-25 NOTE — Anesthesia Procedure Notes (Signed)
Procedure Name: Intubation Date/Time: 02/25/2017 2:18 PM Performed by: BSJGGEZ, Iretha Kirley Pre-anesthesia Checklist: Patient identified, Emergency Drugs available, Suction available, Patient being monitored and Timeout performed Patient Re-evaluated:Patient Re-evaluated prior to induction Oxygen Delivery Method: Circle system utilized Preoxygenation: Pre-oxygenation with 100% oxygen Induction Type: IV induction Laryngoscope Size: Mac and 3 Grade View: Grade II Tube size: 6.5 mm Number of attempts: 1 Placement Confirmation: breath sounds checked- equal and bilateral,  CO2 detector,  positive ETCO2 and ETT inserted through vocal cords under direct vision Secured at: 21 cm Tube secured with: Tape

## 2017-02-25 NOTE — Transfer of Care (Signed)
Immediate Anesthesia Transfer of Care Note  Patient: Casey BakerHazel H Crowson  Procedure(s) Performed: Procedure(s): ARTHROPLASTY BIPOLAR HIP (HEMIARTHROPLASTY) (Right)  Patient Location: PACU  Anesthesia Type:General  Level of Consciousness: awake  Airway & Oxygen Therapy: Patient Spontanous Breathing and Patient connected to face mask oxygen  Post-op Assessment: Report given to RN  Post vital signs: Reviewed and stable  Last Vitals:  Vitals:   02/25/17 1115 02/25/17 1133  BP:  (!) 173/97  Pulse:  (!) 103  Resp:  16  Temp:  36.6 C  SpO2: 100% 100%    Last Pain:  Vitals:   02/25/17 1110  TempSrc:   PainSc: 9          Complications: No apparent anesthesia complications

## 2017-02-25 NOTE — ED Notes (Addendum)
Pt in XR/CT. Daughter in room now.

## 2017-02-25 NOTE — Progress Notes (Addendum)
Received pt from ED. Pt alert X2 with confusion and dementia. Complaining of pain in right hip, moaning. PRN morphine given.  BP 173/97. Metoprolol given with sip of water. SCD placed on left leg. CHG bath completed. Consent signed by daughter [facility](POA) for surgery. Given report to OR. Waiting for orderly to pick up for surgery.

## 2017-02-25 NOTE — ED Notes (Signed)
Patient transported to X-ray 

## 2017-02-25 NOTE — ED Provider Notes (Addendum)
[facility] Emergency Department Provider Note   First MD Initiated Contact with Patient 02/25/17 [phone]     (approximate)  I have reviewed the triage vital signs and the nursing notes.   HISTORY  Chief Complaint Fall    HPI Casey Sanford is a 83 y.o. female with below list of medical conditions [facility] the emergency department [facility] Patient admits to 10 out of 10 right hip pain worse with leg movements.   Past Medical History:  Diagnosis Date  . A-fib (HCC)   . Dementia   . Hypertension   . Thyroid disease   . TIA (transient ischemic attack)     Patient Active Problem List   Diagnosis Date Noted  . Right sided weakness 07/23/2016  . Expressive aphasia 07/23/2016  . Hyperlipidemia 07/23/2016  . Atrial fibrillation (HCC) 07/23/2016  . Generalized weakness 07/23/2016  . ICAO (internal carotid artery occlusion), right 07/23/2016  . Acute ischemic left PCA stroke (HCC) 07/23/2016  . CVA (cerebral vascular accident) (HCC) 07/21/2016  . Atherosclerosis of native arteries of the extremities with ulceration (HCC) 06/29/2016  . Chronic ulcer of right great toe (HCC) 06/29/2016  . Essential hypertension 06/29/2016  . Hypothyroidism 06/29/2016    Past Surgical History:  Procedure Laterality Date  . CHOLECYSTECTOMY    . PERIPHERAL VASCULAR CATHETERIZATION Right 06/09/2015   Procedure: Lower Extremity Angiography;  Surgeon: Renford DillsGregory G Schnier, MD;  Location: [facility] INVASIVE CV LAB;  Service: Cardiovascular;  Laterality: Right;  . PERIPHERAL VASCULAR CATHETERIZATION Right 06/09/2015   Procedure: Lower Extremity Intervention;  Surgeon: Renford DillsGregory G Schnier, MD;  Location: [facility] INVASIVE CV LAB;  Service: Cardiovascular;  Laterality: Right;    Prior to Admission medications   Medication Sig Start Date End Date Taking? Authorizing Provider  apixaban (ELIQUIS) 5 MG TABS tablet  Take 1 tablet (5 mg total) by mouth 2 (two) times daily. 07/23/16   Katharina CaperVaickute, Rima, MD  aspirin 81 MG chewable tablet Chew by mouth daily.    [provider]  atorvastatin (LIPITOR) 40 MG tablet Take 1 tablet (40 mg total) by mouth daily at 6 PM. 07/23/16   Katharina CaperVaickute, Rima, MD  docusate sodium (COLACE) 100 MG capsule Take 100 mg by mouth every other day.     [provider]  furosemide (LASIX) 20 MG tablet Take 1 tablet by mouth daily. 01/14/15   [provider]  guaifenesin (ROBITUSSIN) 100 MG/5ML syrup Take 100 mg by mouth every 6 (six) hours as needed for cough.    [provider]  hydrocortisone cream 1 % Apply 1 application topically every 12 (twelve) hours as needed for itching.    [provider]  levothyroxine (SYNTHROID, LEVOTHROID) 50 MCG tablet Take 1 tablet by mouth daily. 01/21/15   [provider]  losartan (COZAAR) 50 MG tablet Take 1 tablet by mouth daily. 01/26/15   [provider]  metoprolol tartrate (LOPRESSOR) 25 MG tablet Take 1 tablet (25 mg total) by mouth 2 (two) times daily. 07/23/16   Katharina CaperVaickute, Rima, MD  Potassium Chloride CR (MICRO-K) 8 MEQ CPCR capsule CR Take 1 capsule by mouth daily. 01/14/15   [provider]  Propylene Glycol (SYSTANE BALANCE) 0.6 % SOLN Apply 1 drop to eye 3 (three) times daily.    [provider]  risperiDONE (RISPERDAL) 0.5 MG tablet Take 1 tablet by mouth at bedtime.  01/27/15   [provider]  senna-docusate (SENOKOT-S)  8.6-50 MG tablet Take 1 tablet by mouth at bedtime as needed for mild constipation. 07/23/16   Katharina Caper, MD    Allergies Sulfamethoxazole-trimethoprim and Cephalexin  Family History  Problem Relation Age of Onset  . Hypertension Mother     Social History Social History  Substance Use Topics  . Smoking status: Never Smoker  . Smokeless tobacco: Never Used  . Alcohol use 0.6 oz/week    1 Glasses of wine per week     Comment: nightly     Review of Systems Constitutional: No fever/chills Eyes: No visual changes. ENT: No sore throat. Cardiovascular: Denies chest pain. Respiratory: Denies shortness of breath. Gastrointestinal: No abdominal pain.  No nausea, no vomiting.  No diarrhea.  No constipation. Genitourinary: Negative for dysuria. Musculoskeletal: Negative for neck pain.  Positive for right hip and low back pain Integumentary: Negative for rash. Neurological: Negative for headaches, focal weakness or numbness.   ____________________________________________   PHYSICAL EXAM:  VITAL SIGNS: ED Triage Vitals [02/25/17 0643]  Enc Vitals Group     BP (!) 192/90     Pulse Rate 98     Resp 20     Temp      Temp src      SpO2 93 %     Weight      Height      Head Circumference      Peak Flow      Pain Score      Pain Loc      Pain Edu?      Excl. in GC?     Constitutional: Alert and oriented. Apparent discomfort Eyes: Conjunctivae are normal. PERRL. EOMI. Head: Atraumatic. Mouth/Throat: Mucous membranes are moist.  Oropharynx non-erythematous. Neck: No stridor.  No cervical spine tenderness to palpation. Cardiovascular: Normal rate, regular rhythm. Good peripheral circulation. Grossly normal heart sounds. Respiratory: Normal respiratory effort.  No retractions. Lungs CTAB. Gastrointestinal: Soft and nontender. No distention.  Musculoskeletal:Pain to palpation of the right hip  Neurologic:  Normal speech and language. No gross focal neurologic deficits are appreciated.  Skin:  Skin is warm, dry and intact. No rash noted. Psychiatric: Mood and affect are normal. Speech and behavior are normal.  ____________________________________________   LABS (all labs ordered are listed, but only abnormal results are displayed)  Labs Reviewed  BASIC METABOLIC PANEL - Abnormal; Notable for the following:       Result Value   Glucose, Bld 112 (*)    All other components within normal limits  CBC - Abnormal;  Notable for the following:    WBC 12.5 (*)    RBC 3.38 (*)    MCV 114.9 (*)    MCH 39.4 (*)    All other components within normal limits  TROPONIN I    RADIOLOGY I, [place] BROWN, personally viewed and evaluated these images (plain radiographs) as part of my medical decision making, as well as reviewing the written report by the radiologist.  Ct Head Wo Contrast  Result Date: 02/25/2017 CLINICAL DATA:  Unwitnessed fall.  History of dementia. EXAM: CT HEAD WITHOUT CONTRAST TECHNIQUE: Contiguous axial images were obtained from the base of the skull through the vertex without intravenous contrast. COMPARISON:  Head CT dated 07/21/2016 and brain MRI dated 07/22/2016 FINDINGS: Brain: There is mild generalized age-related parenchymal atrophy with commensurate dilatation of the ventricles and sulci. Chronic small vessel ischemic changes again noted within the bilateral periventricular white matter. Small old lacunar infarct noted in the left basal  ganglia. Old infarct within the left occipital lobe with associated encephalomalacia. There is no mass, hemorrhage, edema or other evidence of acute parenchymal abnormality. No extra-axial hemorrhage. Vascular: There are chronic calcified atherosclerotic changes of the large vessels at the skull base. No unexpected hyperdense vessel. Skull: Normal. Negative for fracture or focal lesion. Sinuses/Orbits: No acute finding. Other: None. IMPRESSION: 1. No acute findings. No intracranial mass, hemorrhage or edema. No skull fracture. 2. Chronic ischemic changes, as detailed above. Electronically Signed   By: Bary Richard M.D.   On: 02/25/2017 07:34   Dg Chest Port 1 View  Result Date: 02/25/2017 CLINICAL DATA:  Unwitnessed fall. EXAM: PORTABLE CHEST 1 VIEW COMPARISON:  Radiograph of February 05, 2015. FINDINGS: Stable cardiomediastinal silhouette. Atherosclerosis of thoracic aorta is noted. No pneumothorax or pleural effusion is noted. Right lung is clear. Minimal left  basilar subsegmental atelectasis or scarring is noted. Bony thorax is unremarkable. IMPRESSION: Aortic atherosclerosis. Minimal left basilar subsegmental atelectasis or scarring. Electronically Signed   By: Lupita Raider, M.D.   On: 02/25/2017 07:22   Dg Hip Unilat W Or Wo Pelvis 2-3 Views Right  Result Date: 02/25/2017 CLINICAL DATA:  Right hip pain after fall. EXAM: DG HIP (WITH OR WITHOUT PELVIS) 2-3V RIGHT COMPARISON:  None. FINDINGS: Moderately displaced fracture is seen involving the proximal right femoral neck. Left hip appears normal. IMPRESSION: Moderately displaced proximal right femoral neck fracture. Electronically Signed   By: Lupita Raider, M.D.   On: 02/25/2017 07:23      Procedures   ____________________________________________   INITIAL IMPRESSION / ASSESSMENT AND PLAN / ED COURSE  Pertinent labs & imaging results that were available during my care of the patient were reviewed by me and considered in my medical decision making (see chart for details).  83 year old female presenting with history of physical exam concerning for possible right hip fracture which was confirmed by x-ray. CT scan pending at this time as well as laboratory data patient's care transferred to Dr. see Dr.   Clinical Course as of Feb 25 741  Wynelle Link Feb 25, 2017  0708 I took over this patient from Dr. Manson Passey.  Pending XRs to eval for hip or pelvic fx, CT head and  labs.  If has fx plan for likely admission.   [SS]    Clinical Course User Index [SS] Dionne Bucy, MD    ____________________________________________  FINAL CLINICAL IMPRESSION(S) / ED DIAGNOSES  Final diagnoses:  Closed fracture of right hip, initial encounter [facility])     MEDICATIONS GIVEN DURING THIS VISIT:  Medications  morphine 4 MG/ML injection (  Not Given 02/25/17 0739)  morphine 2 MG/ML injection 2 mg (2 mg Intravenous Given 02/25/17 0657)  ondansetron (ZOFRAN) injection 4 mg (4 mg Intravenous Given 02/25/17 0657)    morphine 4 MG/ML injection 4 mg (4 mg Intravenous Given 02/25/17 0736)     NEW OUTPATIENT MEDICATIONS STARTED DURING THIS VISIT:  New Prescriptions   No medications on file    Modified Medications   No medications on file    Discontinued Medications   No medications on file     Note:  This document was prepared using Dragon voice recognition software and may include unintentional dictation errors.    Darci Current, MD 02/25/17 1308    Darci Current, MD 02/25/17 [phone]

## 2017-02-25 NOTE — Consult Note (Signed)
ORTHOPAEDIC CONSULTATION  REQUESTING PHYSICIAN: Ramonita LabGouru, Aruna, MD  Chief Complaint: Right hip status post fall  HPI: Casey Sanford is a 83 y.o. female who complains of right hip pain after a fall. Patient has dementia at baseline. She is seen in her hospital room today with 2 daughters at the bedside. Patient presents and muscle spasms. She is unable to provide an accurate history. History is obtained from the family.  Past Medical History:  Diagnosis Date  . A-fib (HCC)   . Atrial fibrillation (HCC) 2013   per family  . Dementia   . Hypertension   . Thyroid disease   . TIA (transient ischemic attack)    Past Surgical History:  Procedure Laterality Date  . CHOLECYSTECTOMY    . PERIPHERAL VASCULAR CATHETERIZATION Right 06/09/2015   Procedure: Lower Extremity Angiography;  Surgeon: Renford DillsGregory G Schnier, MD;  Location: [facility] INVASIVE CV LAB;  Service: Cardiovascular;  Laterality: Right;  . PERIPHERAL VASCULAR CATHETERIZATION Right 06/09/2015   Procedure: Lower Extremity Intervention;  Surgeon: Renford DillsGregory G Schnier, MD;  Location: [facility] INVASIVE CV LAB;  Service: Cardiovascular;  Laterality: Right;   Social History   Social History  . Marital status: Unknown    Spouse name: N/A  . Number of children: N/A  . Years of education: N/A   Social History Main Topics  . Smoking status: Never Smoker  . Smokeless tobacco: Never Used  . Alcohol use No     Comment: nightly  . Drug use: No  . Sexual activity: Not Currently   Other Topics Concern  . None   Social History Narrative  . None   Family History  Problem Relation Age of Onset  . Hypertension Mother    Allergies  Allergen Reactions  . Sulfamethoxazole-Trimethoprim Rash  . Cephalexin Itching   Prior to Admission medications   Medication Sig Start Date End Date Taking? Authorizing Provider  acetaminophen (TYLENOL) 500 MG tablet Take 500 mg by mouth every 6 (six) hours as needed (hip pain).   Yes [provider]   Emollient (AVEENO DAILY MOISTURIZING) LOTN Apply 1 application topically every Monday, Wednesday, and Friday at 8 PM. 06/26/16  Yes [provider]  levothyroxine (SYNTHROID, LEVOTHROID) 50 MCG tablet Take 1 tablet by mouth daily. 01/21/15  Yes [provider]  Propylene Glycol (SYSTANE BALANCE) 0.6 % SOLN Place 1 drop into the right eye 3 (three) times daily. For lubricant. Wait 3-5 minutes between different drops   Yes [provider]  risperiDONE (RISPERDAL) 1 MG tablet Take 1 tablet by mouth at bedtime.  11/22/16  Yes [provider]  senna-docusate (SENOKOT-S) 8.6-50 MG tablet Take 1 tablet by mouth at bedtime as needed for mild constipation. 07/23/16  Yes Katharina CaperVaickute, Rima, MD  apixaban (ELIQUIS) 5 MG TABS tablet Take 1 tablet (5 mg total) by mouth 2 (two) times daily. Patient not taking: Reported on 02/25/2017 07/23/16   Katharina CaperVaickute, Rima, MD  aspirin 81 MG chewable tablet Chew by mouth daily.    [provider]  atorvastatin (LIPITOR) 40 MG tablet Take 1 tablet (40 mg total) by mouth daily at 6 PM. Patient not taking: Reported on 02/25/2017 07/23/16   Katharina CaperVaickute, Rima, MD  docusate sodium (COLACE) 100 MG capsule Take 100 mg by mouth every other day.     [provider]  furosemide (LASIX) 20 MG tablet Take 1 tablet by mouth daily. 01/14/15   [provider]  guaifenesin (ROBITUSSIN) 100 MG/5ML syrup Take 100 mg by mouth every 6 (  six) hours as needed for cough.    [provider]  hydrocortisone cream 1 % Apply 1 application topically every 12 (twelve) hours as needed for itching.    [provider]  losartan (COZAAR) 50 MG tablet Take 1 tablet by mouth daily. 01/26/15   [provider]  metoprolol tartrate (LOPRESSOR) 25 MG tablet Take 1 tablet (25 mg total) by mouth 2 (two) times daily. Patient not taking: Reported on 02/25/2017 07/23/16   Katharina Caper, MD  Potassium Chloride CR (MICRO-K) 8 MEQ CPCR capsule CR Take 1 capsule by  mouth daily. 01/14/15   [provider]   Ct Head Wo Contrast  Result Date: 02/25/2017 CLINICAL DATA:  Unwitnessed fall.  History of dementia. EXAM: CT HEAD WITHOUT CONTRAST TECHNIQUE: Contiguous axial images were obtained from the base of the skull through the vertex without intravenous contrast. COMPARISON:  Head CT dated 07/21/2016 and brain MRI dated 07/22/2016 FINDINGS: Brain: There is mild generalized age-related parenchymal atrophy with commensurate dilatation of the ventricles and sulci. Chronic small vessel ischemic changes again noted within the bilateral periventricular white matter. Small old lacunar infarct noted in the left basal ganglia. Old infarct within the left occipital lobe with associated encephalomalacia. There is no mass, hemorrhage, edema or other evidence of acute parenchymal abnormality. No extra-axial hemorrhage. Vascular: There are chronic calcified atherosclerotic changes of the large vessels at the skull base. No unexpected hyperdense vessel. Skull: Normal. Negative for fracture or focal lesion. Sinuses/Orbits: No acute finding. Other: None. IMPRESSION: 1. No acute findings. No intracranial mass, hemorrhage or edema. No skull fracture. 2. Chronic ischemic changes, as detailed above. Electronically Signed   By: Bary Richard M.D.   On: 02/25/2017 07:34   Dg Chest Port 1 View  Result Date: 02/25/2017 CLINICAL DATA:  Unwitnessed fall. EXAM: PORTABLE CHEST 1 VIEW COMPARISON:  Radiograph of February 05, 2015. FINDINGS: Stable cardiomediastinal silhouette. Atherosclerosis of thoracic aorta is noted. No pneumothorax or pleural effusion is noted. Right lung is clear. Minimal left basilar subsegmental atelectasis or scarring is noted. Bony thorax is unremarkable. IMPRESSION: Aortic atherosclerosis. Minimal left basilar subsegmental atelectasis or scarring. Electronically Signed   By: Lupita Raider, M.D.   On: 02/25/2017 07:22   Dg Hip Unilat W Or Wo Pelvis 2-3 Views  Right  Result Date: 02/25/2017 CLINICAL DATA:  Right hip pain after fall. EXAM: DG HIP (WITH OR WITHOUT PELVIS) 2-3V RIGHT COMPARISON:  None. FINDINGS: Moderately displaced fracture is seen involving the proximal right femoral neck. Left hip appears normal. IMPRESSION: Moderately displaced proximal right femoral neck fracture. Electronically Signed   By: Lupita Raider, M.D.   On: 02/25/2017 07:23    Positive ROS: All other systems have been reviewed and were otherwise negative with the exception of those mentioned in the HPI and as above.  Physical Exam: General: Patient is resting comfortably with intermittent spasms.  MUSCULOSKELETAL: Right hip: Patient's skin is intact. There is shortening and external rotation of the right lower extremity. There is no erythema or ecchymosis or swelling. The thigh and leg compartments are soft and compressible. She has palpable pedal pulses. There is spontaneous movement of the right foot including dorsiflexion of the ankle. Sensation cannot be determined based on the patient's sedation.  Assessment: Right femoral neck hip fracture, displaced  Plan:  To the patient's daughters the nature of her fracture. They understand she has a displaced femoral neck hip fracture. I am recommending treatment with a hemiarthroplasty. Given the displacement of the  fracture she has lacerated the feeding vessels of the femoral head and I do not recommend ORIF. I described the hemiarthroplasty procedure in detail along with postop course.  They understand the risks and benefits of the surgery as well. Anderson the risks include but are not limited to infection requiring removal of prosthesis, bleeding requiring blood transfusion, nerve or blood vessel injury, persistent hip pain, leg length discrepancy, change in lower extremity rotation, dislocation, fracture, hardware failure and the need for further surgery. They understand the medical risks include but are not limited to DVT  and pulmonary wasn't, myocardial infarction, stroke, pneumonia, respiratory failure and death. They understood these risks and wished proceed. I marked the patient's right hip with my initials and the word yes according the hospital's correct site of surgery protocol. I reviewed all the labs and regular studies in preparation for this case. Patient has been cleared by the hospitalist service for surgery. She has stopped Eliquis several months ago despite having atrial fibrillation due to her fall risk and her coagulation labs are within normal limits.    Juanell Fairly, MD    02/25/2017 12:51 PM

## 2017-02-25 NOTE — Anesthesia Post-op Follow-up Note (Signed)
Anesthesia QCDR form completed.        

## 2017-02-26 MED ORDER — SODIUM CHLORIDE 0.9 % IV BOLUS (SEPSIS)
250.0000 mL | Freq: Once | INTRAVENOUS | Status: AC
  Administered 2017-02-26: 250 mL via INTRAVENOUS

## 2017-02-26 NOTE — Progress Notes (Signed)
Pt more alert and bp more stable. Currently bp is 108/58 manually and temp is 98.9 orally. Pt conversing and answering questions appropriately. Tylenol  given for pain. Pt tolerated well. Foley dc'd at 0630am and external catheter placed. nad noted at this time, will cont to monitor

## 2017-02-26 NOTE — Progress Notes (Signed)
[facility] [place] [facility]   PATIENT NAME: Casey OkaHazel Sanford    MR#:  [id number]  DATE OF BIRTH:  January 08, 1923  SUBJECTIVE:  CHIEF COMPLAINT:  Patient is reporting hip pain. Had surgery yesterday. Daughter at bedside.  REVIEW OF SYSTEMS:  CONSTITUTIONAL: No fever, fatigue or weakness.  EYES: No blurred or double vision.  EARS, NOSE, AND THROAT: No tinnitus or ear pain.  RESPIRATORY: No cough, shortness of breath, wheezing or hemoptysis.  CARDIOVASCULAR: No chest pain, orthopnea, edema.  GASTROINTESTINAL: No nausea, vomiting, diarrhea or abdominal pain.  GENITOURINARY: No dysuria, hematuria.  ENDOCRINE: No polyuria, nocturia,  HEMATOLOGY: No anemia, easy bruising or bleeding SKIN: No rash or lesion. MUSCULOSKELETAL: right hip pain.   NEUROLOGIC: No tingling, numbness, weakness.  PSYCHIATRY: No anxiety or depression.   DRUG ALLERGIES:   Allergies  Allergen Reactions  . Sulfamethoxazole-Trimethoprim Rash  . Cephalexin Itching    VITALS:  Blood pressure (!) 117/32, pulse 63, temperature 98.1 F (36.7 C), temperature source Axillary, resp. rate 18, height 5\' 3"  (1.6 m), weight 60.1 kg (132 lb 9.6 oz), SpO2 100 %.  PHYSICAL EXAMINATION:  GENERAL:  83 y.o.-year-old patient lying in the bed with no acute distress.  EYES: Pupils equal, round, reactive to light and accommodation. No scleral icterus. Extraocular muscles intact.  HEENT: Head atraumatic, normocephalic. Oropharynx and nasopharynx clear.  NECK:  Supple, no jugular venous distention. No thyroid enlargement, no tenderness.  LUNGS: Normal breath sounds bilaterally, no wheezing, rales,rhonchi or crepitation. No use of accessory muscles of respiration.  CARDIOVASCULAR: S1, S2 normal. No murmurs, rubs, or gallops.  ABDOMEN: Soft, nontender, nondistended. Bowel sounds present. No organomegaly or mass.  EXTREMITIES: No pedal edema, cyanosis, or clubbing.  NEUROLOGIC: Cranial nerves II through XII  are intact. Muscle strength 5/5 in all extremities. Sensation intact. Gait not checked.  PSYCHIATRIC: The patient is alert and oriented x 3.  SKIN: No obvious rash, lesion, or ulcer.    LABORATORY PANEL:   CBC  Recent Labs Lab 02/25/17 0645  WBC 12.5*  HGB 13.3  HCT 38.9  PLT 197   ------------------------------------------------------------------------------------------------------------------  Chemistries   Recent Labs Lab 02/25/17 0645  NA 137  K 3.7  CL 104  CO2 25  GLUCOSE 112*  BUN 12  CREATININE 0.72  CALCIUM 9.3   ------------------------------------------------------------------------------------------------------------------  Cardiac Enzymes  Recent Labs Lab 02/25/17 0645  TROPONINI <0.03   ------------------------------------------------------------------------------------------------------------------  RADIOLOGY:  Ct Head Wo Contrast  Result Date: 02/25/2017 CLINICAL DATA:  Unwitnessed fall.  History of dementia. EXAM: CT HEAD WITHOUT CONTRAST TECHNIQUE: Contiguous axial images were obtained from the base of the skull through the vertex without intravenous contrast. COMPARISON:  Head CT dated 07/21/2016 and brain MRI dated 07/22/2016 FINDINGS: Brain: There is mild generalized age-related parenchymal atrophy with commensurate dilatation of the ventricles and sulci. Chronic small vessel ischemic changes again noted within the bilateral periventricular white matter. Small old lacunar infarct noted in the left basal ganglia. Old infarct within the left occipital lobe with associated encephalomalacia. There is no mass, hemorrhage, edema or other evidence of acute parenchymal abnormality. No extra-axial hemorrhage. Vascular: There are chronic calcified atherosclerotic changes of the large vessels at the skull base. No unexpected hyperdense vessel. Skull: Normal. Negative for fracture or focal lesion. Sinuses/Orbits: No acute finding. Other: None. IMPRESSION: 1. No  acute findings. No intracranial mass, hemorrhage or edema. No skull fracture. 2. Chronic ischemic changes, as detailed above. Electronically Signed   By: Anne NgStan  Maynard M.D.  On: 02/25/2017 07:34   Dg Chest Port 1 View  Result Date: 02/25/2017 CLINICAL DATA:  Unwitnessed fall. EXAM: PORTABLE CHEST 1 VIEW COMPARISON:  Radiograph of February 05, 2015. FINDINGS: Stable cardiomediastinal silhouette. Atherosclerosis of thoracic aorta is noted. No pneumothorax or pleural effusion is noted. Right lung is clear. Minimal left basilar subsegmental atelectasis or scarring is noted. Bony thorax is unremarkable. IMPRESSION: Aortic atherosclerosis. Minimal left basilar subsegmental atelectasis or scarring. Electronically Signed   By: Lupita Raider, M.D.   On: 02/25/2017 07:22   Dg Hip Port Unilat With Pelvis 1v Right  Result Date: 02/25/2017 CLINICAL DATA:  Post right hip arthroplasty. EXAM: DG HIP (WITH OR WITHOUT PELVIS) 1V PORT RIGHT COMPARISON:  Preoperative radiographs earlier this day. FINDINGS: Right hip arthroplasty in expected alignment. No periprosthetic lucency or fracture. Recent postsurgical change includes soft tissue air and edema with lateral skin staples. IMPRESSION: Right hip arthroplasty without immediate postoperative complication. Electronically Signed   By: Rubye Oaks M.D.   On: 02/25/2017 16:51   Dg Hip Unilat W Or Wo Pelvis 2-3 Views Right  Result Date: 02/25/2017 CLINICAL DATA:  Right hip pain after fall. EXAM: DG HIP (WITH OR WITHOUT PELVIS) 2-3V RIGHT COMPARISON:  None. FINDINGS: Moderately displaced fracture is seen involving the proximal right femoral neck. Left hip appears normal. IMPRESSION: Moderately displaced proximal right femoral neck fracture. Electronically Signed   By: Lupita Raider, M.D.   On: 02/25/2017 07:23    EKG:   Orders placed or performed during the hospital encounter of 02/25/17  . ED EKG  . ED EKG  . EKG 12-Lead  . EKG 12-Lead    ASSESSMENT AND PLAN:      Casey Sanford  is a 83 y.o. female with a known history of Chronic atrial fibrillation, dementia, hypertension, hypothyroidism and other medical problem is brought into the ED from [facility] nursing facility after she sustained a witnessed fall. Patient was in severe right hip pain and x-ray has revealed right femoral neck fracture.   # Right hip pain-acute secondary to right femoral neck fracture Status post right hip arthroplasty on 02/25/2017 postop day #1 Pain management as needed Consult orthopedics discussed with Dr. Martha Clan Patient is wheelchair bound at at her baseline  #Chronic atrial fibrillation rate controlled On metoprolol Primary care physician has discontinued Eliquis as patient is at high risk for falls resume aspirin  #Hypothyroidism continue Synthroid  #Essential hypertension continue metoprolol Losartan discontinued by primary care physician  #Hyperlipidemia continue Lipitor   All the records are reviewed and case discussed with Care Management/Social Workerr. Management plans discussed with the patient, family and they are in agreement.  CODE STATUS: dnr  TOTAL TIME TAKING CARE OF THIS PATIENT: 35  minutes.   POSSIBLE D/C IN 2  DAYS, DEPENDING ON CLINICAL CONDITION.  Note: This dictation was prepared with Dragon dictation along with smaller phrase technology. Any transcriptional errors that result from this process are unintentional.   Ramonita Lab M.D on 02/26/2017 at 2:17 PM  Between 7am to 6pm - Pager - [phone] After 6pm go to [email or web address] - password [email or web address] [employer]  [facility]  Office  [phone]  CC: Primary care physician; Jaclyn Shaggy, MD

## 2017-02-26 NOTE — Care Management Note (Addendum)
Case Management Note  Patient Details  Name: Casey Sanford MRN: [id number] Date of Birth: 31-Oct-1922  Subjective/Objective:  RNCM consult for discharge planning. [facility]CSW following.                   Action/Plan:   Expected Discharge Date:                  Expected Discharge Plan:  Skilled Nursing Facility  In-House Referral:  Clinical Social Work  Discharge planning Services  CM Consult  Post Acute Care Choice:    Choice offered to:     DME Arranged:    DME Agency:     HH Arranged:    HH Agency:     Status of Service:  Completed, signed off  If discussed at [employer] Meetings, dates discussed:    Additional Comments:  Marily MemosLisa M Gaudencio Chesnut, RN 02/26/2017, 12:46 PM

## 2017-02-26 NOTE — Progress Notes (Signed)
Physical Therapy Treatment Patient Details Name: Casey BakerHazel H Dibella MRN: [id number] DOB: 12-Oct-1922 Today's Date: 02/26/2017    History of Present Illness Pt is a 83 y.o. female presenting to hospital s/p fall with R hip pain.  Imaging showing moderately displaced proximal R femoral neck fx.  Pt s/p R hip hemiarthroplasty 02/25/17.  PMH includes dementia, a-fib, htn, thyroid disease, and TIA.    PT Comments    Pt pre-medicated with pain meds for PT session but pt still vocalizing pain with functional mobility and requiring extra time to perform mobility.  Able to perform squat [facility] and [facility] to bed with 2 assist.  Pt requiring assist and vc's to maintain posterior hip precautions during session.  Will continue to focus on strengthening and decreasing assist with functional mobility per pt tolerance.    Follow Up Recommendations  SNF     Equipment Recommendations  Other (comment) (pt already has recommend manual w/c)    Recommendations for Other Services       Precautions / Restrictions Precautions Precautions: Fall;Posterior Hip Restrictions Weight Bearing Restrictions: Yes RLE Weight Bearing: Weight bearing as tolerated    Mobility  Bed Mobility Overal bed mobility: Needs Assistance Bed Mobility: Supine to Sit;Sit to Supine     Supine to sit: Mod assist;Max assist;+2 for physical assistance;HOB elevated Sit to supine: Mod assist;Max assist;+2 for physical assistance;HOB elevated   General bed mobility comments: assist for trunk and B LE's supine to/from sit; 2 assist to boost pt up in bed  Transfers Overall transfer level: Needs assistance Equipment used: None Transfers: Squat Pivot Transfers     Squat pivot transfers: Mod assist;Max assist;+2 physical assistance     General transfer comment: 2 assist squat pivot [facility] and [facility] to bed; vc's and extra time required for technique  Ambulation/Gait             General Gait Details: Pt  non-ambulatory baseline (gait deferred)   Stairs            Wheelchair Mobility    Modified Rankin (Stroke Patients Only)       Balance Overall balance assessment: Needs assistance;History of Falls Sitting-balance support: Bilateral upper extremity supported;Feet supported Sitting balance-Leahy Scale: Poor Sitting balance - Comments: requires UE support for static sitting balance (CGA)       Standing balance comment: Deferred                            Cognition Arousal/Alertness: Awake/alert Behavior During Therapy: WFL for tasks assessed/performed Overall Cognitive Status: History of cognitive impairments - at baseline (Oriented to self)                                        Exercises      General Comments General comments (skin integrity, edema, etc.): Nursing present entire session.  Pt's daughter's left for PT session.      Pertinent Vitals/Pain Pain Assessment: Faces Faces Pain Scale: Hurts a little bit (8/10 with functional mobility; 2/10 at rest) Pain Location: R hip Pain Descriptors / Indicators: Sore;Tender;Sharp;Shooting Pain Intervention(s): Limited activity within patient's tolerance;Monitored during session;Premedicated before session;Repositioned;Ice applied    Home Living                      Prior Function  PT Goals (current goals can now be found in the care plan section) Acute Rehab PT Goals Patient Stated Goal: to have less pain PT Goal Formulation: With patient/family Time For Goal Achievement: 03/12/17 Potential to Achieve Goals: Fair Progress towards PT goals: Progressing toward goals    Frequency    BID      PT Plan Current plan remains appropriate    Co-evaluation              AM-PAC PT "6 Clicks" Daily Activity  Outcome Measure  Difficulty turning over in bed (including adjusting bedclothes, sheets and blankets)?: Unable Difficulty moving from lying on back to  sitting on the side of the bed? : Unable Difficulty sitting down on and standing up from a chair with arms (e.g., wheelchair, bedside commode, etc,.)?: Unable Help needed moving to and from a bed to chair (including a wheelchair)?: Total Help needed walking in hospital room?: Total Help needed climbing 3-5 steps with a railing? : Total 6 Click Score: 6    End of Session Equipment Utilized During Treatment: Gait belt;Oxygen Activity Tolerance: Patient limited by pain Patient left: in bed;with call bell/phone within reach;with nursing/sitter in room (pillow between pt's knees; nursing and nursing tech present working with pt) Nurse Communication: Mobility status;Precautions;Weight bearing status PT Visit Diagnosis: Other abnormalities of gait and mobility (R26.89);Muscle weakness (generalized) (M62.81);History of falling (Z91.81);Pain Pain - Right/Left: Right Pain - part of body: Hip     Time: 1610-9604 PT Time Calculation (min) (ACUTE ONLY): 44 min  Charges:  $Therapeutic Activity: 38-52 mins                    G CodesHendricks Limes, PT 02/26/17, 4:42 PM [phone]

## 2017-02-26 NOTE — Progress Notes (Signed)
Please [facility] CSW [facility] [employer] made aware. Thank you. Dayna BarkerKaren Robertson RN, BSN, [facility], hospital Liaison [phone]

## 2017-02-26 NOTE — Progress Notes (Signed)
Temp 99.5 ax taken off bear hugger. Will cont to monitor

## 2017-02-26 NOTE — [place] FL2 (Signed)
[place] MEDICAID FL2 LEVEL OF CARE SCREENING TOOL     IDENTIFICATION  Patient Name: Casey Sanford Birthdate: 07/24/1922 Sex: female Admission Date (Current Location): 02/25/2017  [place]   Facility and Address:  [facility], [address], [place], [place] [address]      Provider Number: [id number]  Attending Physician Name and Address:  Ramonita LabGouru, Talishia Betzler, MD  Relative Name and Phone Number:       Current Level of Care: Hospital Recommended Level of Care: Skilled Nursing Facility Prior Approval Number:    Date Approved/Denied:   PASRR Number:  ([id number] )  Discharge Plan: SNF    Current Diagnoses: Patient Active Problem List   Diagnosis Date Noted  . Hip fracture (HCC) 02/25/2017  . Right sided weakness 07/23/2016  . Expressive aphasia 07/23/2016  . Hyperlipidemia 07/23/2016  . Atrial fibrillation (HCC) 07/23/2016  . Generalized weakness 07/23/2016  . ICAO (internal carotid artery occlusion), right 07/23/2016  . Acute ischemic left PCA stroke (HCC) 07/23/2016  . CVA (cerebral vascular accident) (HCC) 07/21/2016  . Atherosclerosis of native arteries of the extremities with ulceration (HCC) 06/29/2016  . Chronic ulcer of right great toe (HCC) 06/29/2016  . Essential hypertension 06/29/2016  . Hypothyroidism 06/29/2016    Orientation RESPIRATION BLADDER Height & Weight     Self  Normal Incontinent Weight: 132 lb 9.6 oz (60.1 kg) Height:  5\' 3"  (160 cm)  BEHAVIORAL SYMPTOMS/MOOD NEUROLOGICAL BOWEL NUTRITION STATUS      Continent Diet (Diet: Clear Liquid to be Advanced. )  AMBULATORY STATUS COMMUNICATION OF NEEDS Skin   Extensive Assist Verbally Surgical wounds (Incision: Right Hip. )                       Personal Care Assistance Level of Assistance  Bathing, Feeding, Dressing Bathing Assistance: Limited assistance Feeding assistance: Limited assistance Dressing Assistance: Limited  assistance Total Care Assistance: Limited assistance   Functional Limitations Info  Sight, Hearing, Speech Sight Info: Adequate Hearing Info: Adequate Speech Info: Adequate    SPECIAL CARE FACTORS FREQUENCY  PT (By licensed PT), OT (By licensed OT)     PT Frequency:  (5) OT Frequency:  (5)            Contractures Contractures Info: Not present    Additional Factors Info  Code Status, Allergies Code Status Info:  (DNR ) Allergies Info:  (Sulfamethoxazole-trimethoprim, Cephalexin)           Current Medications (02/26/2017):  This is the current hospital active medication list Current Facility-Administered Medications  Medication Dose Route Frequency Provider Last Rate Last Dose  . 0.9 %  sodium chloride infusion  75 mL/hr Intravenous Continuous Juanell FairlyKrasinski, Kevin, MD 75 mL/hr at 02/25/17 2321 75 mL/hr at 02/25/17 2321  . acetaminophen (TYLENOL) tablet 500 mg  500 mg Oral Q6H PRN Kyvon Hu, MD   500 mg at 02/26/17 0523  . alum & mag hydroxide-simeth (MAALOX/MYLANTA) 200-200-20 MG/5ML suspension 30 mL  30 mL Oral Q4H PRN Juanell FairlyKrasinski, Kevin, MD      . atorvastatin (LIPITOR) tablet 40 mg  40 mg Oral q1800 Lemon Sternberg, MD      . bisacodyl (DULCOLAX) suppository 10 mg  10 mg Rectal Daily PRN Juanell FairlyKrasinski, Kevin, MD      . enoxaparin (LOVENOX) injection 40 mg  40 mg Subcutaneous Q24H Juanell FairlyKrasinski, Kevin, MD      . guaifenesin (ROBITUSSIN) 100 MG/5ML syrup 100 mg  100 mg Oral  Q6H PRN Ramonita Lab, MD      . HYDROcodone-acetaminophen (NORCO/VICODIN) 5-325 MG per tablet 1-2 tablet  1-2 tablet Oral Q6H PRN Juanell Fairly, MD      . levothyroxine (SYNTHROID, LEVOTHROID) tablet 50 mcg  50 mcg Oral Regenia Skeeter, Deanna Artis, MD      . magnesium citrate solution 1 Bottle  1 Bottle Oral Once PRN Juanell Fairly, MD      . menthol-cetylpyridinium (CEPACOL) lozenge 3 mg  1 lozenge Oral PRN Juanell Fairly, MD       Or  . phenol (CHLORASEPTIC) mouth spray 1 spray  1 spray Mouth/Throat PRN Juanell Fairly, MD      . methocarbamol (ROBAXIN) tablet 500 mg  500 mg Oral Q6H PRN Juanell Fairly, MD       Or  . methocarbamol (ROBAXIN) 500 mg in dextrose 5 % 50 mL IVPB  500 mg Intravenous Q6H PRN Juanell Fairly, MD      . metoprolol tartrate (LOPRESSOR) tablet 25 mg  25 mg Oral BID Mccayla Shimada, MD   25 mg at 02/25/17 1259  . morphine 2 MG/ML injection 2 mg  2 mg Intravenous Q2H PRN Juanell Fairly, MD      . ondansetron [facility]) tablet 4 mg  4 mg Oral Q6H PRN Juanell Fairly, MD       Or  . ondansetron [facility]) injection 4 mg  4 mg Intravenous Q6H PRN Juanell Fairly, MD      . polyethylene glycol (MIRALAX / GLYCOLAX) packet 17 g  17 g Oral Daily PRN Juanell Fairly, MD      . polyvinyl alcohol (LIQUIFILM TEARS) 1.4 % ophthalmic solution 1 drop  1 drop Right Eye TID Synthia Fairbank, MD      . potassium chloride (K-DUR,KLOR-CON) CR tablet 10 mEq  10 mEq Oral Daily Tali Coster, MD      . risperiDONE (RISPERDAL) tablet 1 mg  1 mg Oral QHS Tyresse Jayson, MD      . senna (SENOKOT) tablet 8.6 mg  1 tablet Oral BID Juanell Fairly, MD      . senna-docusate (Senokot-S) tablet 1 tablet  1 tablet Oral QHS PRN Ramonita Lab, MD         Discharge Medications: Please see discharge summary for a list of discharge medications.  Relevant Imaging Results:  Relevant Lab Results:   Additional Information  (SSN: [id number])  Sample, Darleen Crocker, LCSW

## 2017-02-26 NOTE — Progress Notes (Signed)
Subjective: Postoperative day 1 status post right hip hemiarthroplasty. Patient has advanced dementia and is unable to provide an accurate history. She denies any right hip pain and appears comfortable in bed.  Objective:   VITALS:   Vitals:   02/26/17 0154 02/26/17 0333 02/26/17 0524 02/26/17 0850  BP: 97/64 (!) 115/57 (!) 108/58 112/62  Pulse: 92 92  87  Resp:      Temp:   98.9 F (37.2 C)   TempSrc:   Oral   SpO2:      Weight:      Height:        PHYSICAL EXAM: Right lower extremity: Neurovascular intact Sensation intact distally Intact pulses distally Dorsiflexion/Plantar flexion intact Incision: dressing C/D/I No cellulitis present Compartment soft  LABS  No results found for this or any previous visit (from the past 24 hour(s)).  Ct Head Wo Contrast  Result Date: 02/25/2017 CLINICAL DATA:  Unwitnessed fall.  History of dementia. EXAM: CT HEAD WITHOUT CONTRAST TECHNIQUE: Contiguous axial images were obtained from the base of the skull through the vertex without intravenous contrast. COMPARISON:  Head CT dated 07/21/2016 and brain MRI dated 07/22/2016 FINDINGS: Brain: There is mild generalized age-related parenchymal atrophy with commensurate dilatation of the ventricles and sulci. Chronic small vessel ischemic changes again noted within the bilateral periventricular white matter. Small old lacunar infarct noted in the left basal ganglia. Old infarct within the left occipital lobe with associated encephalomalacia. There is no mass, hemorrhage, edema or other evidence of acute parenchymal abnormality. No extra-axial hemorrhage. Vascular: There are chronic calcified atherosclerotic changes of the large vessels at the skull base. No unexpected hyperdense vessel. Skull: Normal. Negative for fracture or focal lesion. Sinuses/Orbits: No acute finding. Other: None. IMPRESSION: 1. No acute findings. No intracranial mass, hemorrhage or edema. No skull fracture. 2. Chronic ischemic  changes, as detailed above. Electronically Signed   By: Bary Richard M.D.   On: 02/25/2017 07:34   Dg Chest Port 1 View  Result Date: 02/25/2017 CLINICAL DATA:  Unwitnessed fall. EXAM: PORTABLE CHEST 1 VIEW COMPARISON:  Radiograph of February 05, 2015. FINDINGS: Stable cardiomediastinal silhouette. Atherosclerosis of thoracic aorta is noted. No pneumothorax or pleural effusion is noted. Right lung is clear. Minimal left basilar subsegmental atelectasis or scarring is noted. Bony thorax is unremarkable. IMPRESSION: Aortic atherosclerosis. Minimal left basilar subsegmental atelectasis or scarring. Electronically Signed   By: Lupita Raider, M.D.   On: 02/25/2017 07:22   Dg Hip Port Unilat With Pelvis 1v Right  Result Date: 02/25/2017 CLINICAL DATA:  Post right hip arthroplasty. EXAM: DG HIP (WITH OR WITHOUT PELVIS) 1V PORT RIGHT COMPARISON:  Preoperative radiographs earlier this day. FINDINGS: Right hip arthroplasty in expected alignment. No periprosthetic lucency or fracture. Recent postsurgical change includes soft tissue air and edema with lateral skin staples. IMPRESSION: Right hip arthroplasty without immediate postoperative complication. Electronically Signed   By: Rubye Oaks M.D.   On: 02/25/2017 16:51   Dg Hip Unilat W Or Wo Pelvis 2-3 Views Right  Result Date: 02/25/2017 CLINICAL DATA:  Right hip pain after fall. EXAM: DG HIP (WITH OR WITHOUT PELVIS) 2-3V RIGHT COMPARISON:  None. FINDINGS: Moderately displaced fracture is seen involving the proximal right femoral neck. Left hip appears normal. IMPRESSION: Moderately displaced proximal right femoral neck fracture. Electronically Signed   By: Lupita Raider, M.D.   On: 02/25/2017 07:23    Assessment/Plan: 1 Day Post-Op   Active Problems:   Hip fracture [facility]  Patient doing  well postop. Her postop x-rays demonstrate a hemiarthroplasty prosthesis is well sized and positioned. The hip joint is located. Patient may be weightbearing as  tolerated with physical therapy.  Labs obtained in order today but are not yet available. She should complete 24 hours postop antibiotics. She'll begin Lovenox for DVT prophylaxis.   Juanell FairlyKRASINSKI, Gionna Polak , MD 02/26/2017, 12:44 PM

## 2017-02-26 NOTE — Care Management Note (Signed)
Case Management Note  Patient Details  Name: Casey Sanford MRN: [id number] Date of Birth: [birth year]  Subjective/Objective: From [facility]. Patient is a bundle patient. PT recommending SNF. CSW aware and following. RNCM will follow along for potential  home health needs                   Action/Plan:   Expected Discharge Date:                  Expected Discharge Plan:     In-House Referral:  Clinical Social Work  Discharge planning Services  CM Consult  Post Acute Care Choice:    Choice offered to:     DME Arranged:    DME Agency:     HH Arranged:    HH Agency:     Status of Service:  In process, will continue to follow  If discussed at Long Length of Stay Meetings, dates discussed:    Additional Comments:  Marily MemosLisa M Peniel Biel, RN 02/26/2017, 12:23 PM

## 2017-02-26 NOTE — Plan of Care (Signed)
Dr. Text and informed of 0cc urine output in 9hrs, since Foley removal.  Since Foley removal, patient has had external cath in place, attempted bedpan and also BSC (still no output).  Bladder scan 195 cc. Dr. Gerlene FeePlaced orders for 75hr NS and bladder scan in 2 hours. RN will continue to assess.

## 2017-02-26 NOTE — Evaluation (Signed)
Physical Therapy Evaluation Patient Details Name: Casey Sanford MRN: [id number] DOB: [birth year] Today's Date: 02/26/2017   History of Present Illness  Pt is a 83 y.o. female presenting to hospital s/p fall with R hip pain.  Imaging showing moderately displaced proximal R femoral neck fx.  Pt s/p R hip hemiarthroplasty 02/25/17.  PMH includes dementia, a-fib, htn, thyroid disease, and TIA.  Clinical Impression  Prior to hospital admission, pt was modified independent w/c level functional mobility.  Pt lives at [facility].  Currently pt is max assist with bed mobility supine to/from sit (2nd assist to boost pt up in bed end of session).  Significant extra time required to sit pt up on edge of bed (and in general with therapy activities during session) d/t pt's c/o R hip pain.  Pt also reporting mild L knee soreness.  Pt requesting back to bed (d/t significant R hip pain in sitting on edge of bed) so further mobility deferred.  Pt with minimal R hip pain end of session resting in bed.  Pt would benefit from skilled PT to address noted impairments and functional limitations (see below for any additional details).  Upon hospital discharge, recommend pt discharge to STR.    Follow Up Recommendations SNF    Equipment Recommendations  Other (comment) (pt already has recommended manual w/c)    Recommendations for Other Services       Precautions / Restrictions Precautions Precautions: Fall;Posterior Hip Restrictions Weight Bearing Restrictions: Yes RLE Weight Bearing: Weight bearing as tolerated      Mobility  Bed Mobility Overal bed mobility: Needs Assistance Bed Mobility: Supine to Sit;Sit to Supine     Supine to sit: Max assist;HOB elevated Sit to supine: Max assist;HOB elevated   General bed mobility comments: assist for trunk and B LE's supine to/from sit; vc's for technique and significant extra time to perform supine to sit d/t pt c/o R hip pain and needing breaks  between transitions (otherwise pt resisting movement)  Transfers                 General transfer comment: Deferred d/t R hip pain (pt requesting to lay back down d/t R hip pain)  Ambulation/Gait             General Gait Details: Pt non-ambulatory baseline (gait deferred)  Stairs            Wheelchair Mobility    Modified Rankin (Stroke Patients Only)       Balance Overall balance assessment: Needs assistance;History of Falls Sitting-balance support: Bilateral upper extremity supported;Feet supported Sitting balance-Leahy Scale: Poor Sitting balance - Comments: requires UE support for static sitting balance (SBA)       Standing balance comment: Deferred d/t pt requesting to lay back down d/t R hip pain                             Pertinent Vitals/Pain Pain Assessment: Faces Faces Pain Scale: Hurts a little bit (8/10 with mobility; 2/10 at rest beginning and end of session) Pain Location: R hip Pain Descriptors / Indicators: Sore;Tender;Sharp;Shooting Pain Intervention(s): Limited activity within patient's tolerance;Monitored during session;Premedicated before session;Repositioned;Ice applied  Vitals (HR and O2 on 2 L O2 via nasal cannula) stable and WFL throughout treatment session.    Home Living Family/patient expects to be discharged to:: Assisted living               Home  Equipment: Wheelchair - manual [facility]    Prior Function Level of Independence: Needs assistance   Gait / Transfers Assistance Needed: Pt modified independent with w/c level functional mobility.  Propels self in manual w/c via UE's and LE's.  Does not use an AD for transfers.  ADL's / Homemaking Assistance Needed: Assist with bathing, dressing, meal prep, medications.  Independent with eating.        Hand Dominance        Extremity/Trunk Assessment   Upper Extremity Assessment Upper Extremity Assessment:  Generalized weakness    Lower Extremity Assessment Lower Extremity Assessment: RLE deficits/detail;LLE deficits/detail RLE Deficits / Details: ROM WFL RLE: Unable to fully assess due to pain LLE Deficits / Details: ROM WFL; deferred MMT d/t c/o L knee soreness; pt did not perform L LE AROM when requested to assess strength d/t L knee soreness    Cervical / Trunk Assessment Cervical / Trunk Assessment: Normal  Communication   Communication: HOH  Cognition Arousal/Alertness: Awake/alert   Overall Cognitive Status: History of cognitive impairments - at baseline (Oriented to self)                                        General Comments General comments (skin integrity, edema, etc.): Very mild drainage noted from R hip dressing.  Pt's daughter present throughout PT session.  Nursing cleared pt for participation in physical therapy.  Pt agreeable to PT session.    Exercises     Assessment/Plan    PT Assessment Patient needs continued PT services  PT Problem List Decreased strength;Decreased activity tolerance;Decreased balance;Decreased mobility;Decreased knowledge of precautions;Pain       PT Treatment Interventions DME instruction;Functional mobility training;Therapeutic activities;Therapeutic exercise;Balance training;Patient/family education    PT Goals (Current goals can be found in the Care Plan section)  Acute Rehab PT Goals Patient Stated Goal: to have less pain PT Goal Formulation: With patient/family Time For Goal Achievement: 03/12/17 Potential to Achieve Goals: Fair    Frequency BID   Barriers to discharge Decreased caregiver support      Co-evaluation               AM-PAC PT "6 Clicks" Daily Activity  Outcome Measure Difficulty turning over in bed (including adjusting bedclothes, sheets and blankets)?: Unable Difficulty moving from lying on back to sitting on the side of the bed? : Unable Difficulty sitting down on and standing up  from a chair with arms (e.g., wheelchair, bedside commode, etc,.)?: Unable Help needed moving to and from a bed to chair (including a wheelchair)?: Total Help needed walking in hospital room?: Total Help needed climbing 3-5 steps with a railing? : Total 6 Click Score: 6    End of Session Equipment Utilized During Treatment: Oxygen (2 L O2 via nasal cannula) Activity Tolerance: Patient limited by pain Patient left: in bed;with call bell/phone within reach;with bed alarm set;with family/visitor present;with SCD's reapplied (B heels elevated via pillows; hip abduction pillow in place; ice pack in place R hip) Nurse Communication: Mobility status;Precautions;Weight bearing status PT Visit Diagnosis: Other abnormalities of gait and mobility (R26.89);Muscle weakness (generalized) (M62.81);History of falling (Z91.81);Pain Pain - Right/Left: Right Pain - part of body: Hip    Time: 0915-1003 PT Time Calculation (min) (ACUTE ONLY): 48 min   Charges:   PT Evaluation $PT Eval Low Complexity: 1 Low PT Treatments $Therapeutic Activity: 23-37  mins   PT G Codes:   PT G-Codes **NOT FOR INPATIENT CLASS** Functional Assessment Tool Used: AM-PAC 6 Clicks Basic Mobility Functional Limitation: Mobility: Walking and moving around Mobility: Walking and Moving Around Current Status (Z6109): 100 percent impaired, limited or restricted Mobility: Walking and Moving Around Goal Status (U0454): At least 40 percent but less than 60 percent impaired, limited or restricted    Hendricks Limes, PT 02/26/17, 10:23 AM [phone]

## 2017-02-26 NOTE — Progress Notes (Signed)
PT is recommending SNF. Clinical Education officer, museum (CSW) met with patient's daughter Arbie Cookey at bedside. CSW made Arbie Cookey aware of above and presented bed offers. Arbie Cookey chose [facility]. [facility] admissions coordinator at [facility] is aware of accepted bed offer. Bundle case manager is aware of above.  [employer], LCSW [phone]

## 2017-02-26 NOTE — Plan of Care (Signed)
Dr. Nemiah CommanderKalisetti informed of new bladder scan 275cc and still no output since 0630 (when foley was removed).  Gave orders to give 250cc NS bolus and re-assess.  Bladder scan again and if over 300 - [facility] and then do [facility]/O cath if still unable.

## 2017-02-26 NOTE — Progress Notes (Signed)
OT Cancellation Note  Patient Details Name: Casey BakerHazel H Sanford MRN: [id number] DOB: 11-04-1922   Cancelled Treatment:    Reason Eval/Treat Not Completed: Patient at procedure or test/ unavailable. Order received, chart reviewed. Upon attempt, pt starting PT session. Will re-attempt at later date/time as pt is available and as schedule allows.  Richrd PrimeJamie Stiller, MPH, MS, OTR/L ascom [phone] 02/26/17, 2:51 PM

## 2017-02-26 NOTE — Clinical Social Work Placement (Signed)
   CLINICAL SOCIAL WORK PLACEMENT  NOTE  Date:  02/26/2017  Patient Details  Name: Casey Sanford MRN: [id number] Date of Birth: 05/27/1923  Clinical Social Work is seeking post-discharge placement for this patient at the Skilled  Nursing Facility level of care (*CSW will initial, date and re-position this form in  chart as items are completed):  Yes   Patient/family provided with [place] Clinical Social Work Department's list of facilities offering this level of care within the geographic area requested by the patient (or if unable, by the patient's family).  Yes   Patient/family informed of their freedom to choose among providers that offer the needed level of care, that participate in Medicare, Medicaid or managed care program needed by the patient, have an available bed and are willing to accept the patient.  Yes   Patient/family informed of [facility]'s ownership interest in [facility] and [facility], as well as of the fact that they are under no obligation to receive care at these facilities.  PASRR submitted to EDS on       PASRR number received on       Existing PASRR number confirmed on 02/26/17     FL2 transmitted to all facilities in geographic area requested by pt/family on 02/26/17     FL2 transmitted to all facilities within larger geographic area on       Patient informed that his/her managed care company has contracts with or will negotiate with certain facilities, including the following:        Yes   Patient/family informed of bed offers received.  Patient chooses bed at  [facility]     Physician recommends and patient chooses bed at      Patient to be transferred to   on  .  Patient to be transferred to facility by       Patient family notified on   of transfer.  Name of family member notified:        PHYSICIAN       Additional Comment:    _______________________________________________ Jaley Yan, Darleen CrockerBailey M, LCSW 02/26/2017,  3:24 PM

## 2017-02-27 ENCOUNTER — Inpatient Hospital Stay: Payer: Medicare Other

## 2017-02-27 LAB — CBC
HCT: 31.2 % — ABNORMAL LOW (ref 35.0–47.0)
Hemoglobin: 10.8 g/dL — ABNORMAL LOW (ref 12.0–16.0)
MCH: 39.7 pg — ABNORMAL HIGH (ref 26.0–34.0)
MCHC: 34.5 g/dL (ref 32.0–36.0)
MCV: 115.4 fL — ABNORMAL HIGH (ref 80.0–100.0)
PLATELETS: 139 10*3/uL — AB (ref 150–440)
RBC: 2.71 MIL/uL — AB (ref 3.80–5.20)
RDW: 14 % (ref 11.5–14.5)
WBC: 14.4 10*3/uL — AB (ref 3.6–11.0)

## 2017-02-27 LAB — BASIC METABOLIC PANEL
Anion gap: 6 (ref 5–15)
BUN: 9 mg/dL (ref 6–20)
CO2: 22 mmol/L (ref 22–32)
Calcium: 8.2 mg/dL — ABNORMAL LOW (ref 8.9–10.3)
Chloride: 109 mmol/L (ref 101–111)
Creatinine, Ser: 0.65 mg/dL (ref 0.44–1.00)
GFR calc Af Amer: >60 mL/min (ref >60)
GFR calc non Af Amer: >60 mL/min (ref >60)
Glucose, Bld: 133 mg/dL — ABNORMAL HIGH (ref 65–99)
Potassium: 3.8 mmol/L (ref 3.5–5.1)
Sodium: 137 mmol/L (ref 135–145)

## 2017-02-27 MED ORDER — HYDROCODONE-ACETAMINOPHEN 5-325 MG PO TABS: 1.0000 | ORAL_TABLET | Freq: Four times a day (QID) | ORAL | 0 refills | Status: DC | PRN

## 2017-02-27 MED ORDER — ASPIRIN EC 81 MG PO TBEC
81.0000 mg | DELAYED_RELEASE_TABLET | Freq: Every day | ORAL | Status: DC
Start: 2017-02-27 — End: 2017-03-01
  Filled 2017-02-27: qty 1

## 2017-02-27 MED ORDER — POLYETHYLENE GLYCOL 3350 17 G PO PACK: 17.0000 g | PACK | Freq: Every day | ORAL | 0 refills | Status: DC | PRN

## 2017-02-27 NOTE — Progress Notes (Signed)
Pts refusing meds po, VSS, daughter is HCPOA , asking RN to give meds forcibly via iv, will consult psych regarding patient's decision-making capacity, palliative care consult regarding the goals of care

## 2017-02-27 NOTE — Progress Notes (Signed)
Pt's daughter who has POA gives us her blessing to forcibly give her mother meds. Paged Dr. Amado CoeGouru and she requests a psych consult.

## 2017-02-27 NOTE — Evaluation (Signed)
Clinical/Bedside Swallow Evaluation Patient Details  Name: Casey Sanford MRN: [id number] Date of Birth: 08/08/1922  Today's Date: 02/27/2017 Time: SLP Start Time (ACUTE ONLY): 1330 SLP Stop Time (ACUTE ONLY): 1430 SLP Time Calculation (min) (ACUTE ONLY): 60 min  Past Medical History:  Past Medical History:  Diagnosis Date  . A-fib (HCC)   . Atrial fibrillation (HCC) 2013   per family  . Dementia   . Hypertension   . Thyroid disease   . TIA (transient ischemic attack)    Past Surgical History:  Past Surgical History:  Procedure Laterality Date  . CHOLECYSTECTOMY    . PERIPHERAL VASCULAR CATHETERIZATION Right 06/09/2015   Procedure: Lower Extremity Angiography;  Surgeon: Renford DillsGregory G Schnier, MD;  Location: [facility] INVASIVE CV LAB;  Service: Cardiovascular;  Laterality: Right;  . PERIPHERAL VASCULAR CATHETERIZATION Right 06/09/2015   Procedure: Lower Extremity Intervention;  Surgeon: Renford DillsGregory G Schnier, MD;  Location: [facility] INVASIVE CV LAB;  Service: Cardiovascular;  Laterality: Right;   HPI:  Pt  is a 83 y.o. female with a known history of Chronic atrial fibrillation, dementia, hypertension, hypothyroidism and other medical problem is brought [facility] the ED [facility] after she sustained a witnessed fall. Patient was in severe right hip pain and x-ray has revealed right femoral neck fracture. Pt s/p R hip hemiarthroplasty 02/25/17.  Per family and NSG report, pt is having increased periods of confusion even affecting her attention to eating/drinking this morning.  Assessment / Plan / Recommendation Clinical Impression  Pt appeared to present w/ adequate oropharyngeal phase swallow function for consistencies of thin liquids, and puree. Pt's baseline Cognitive decline appears to impact her Cognitive awareness for oral intake; the need for oral intake. This increased confusion can impact her attention to task thus present w/ min aspiration concern if not attending to task.   Pt was fed trials given verbal cues to attend to tasks. No immediate overt s/s of aspiration were noted w/ trials of thin liquids and puree. Vocal quality and respiratory status did not appear to decline w/ po trials. No coughing was noted. During the oral phase, pt exhibited decreased oral awareness for bolus acceptance and required gentle tactile cues of spoon/cup/straw to lips to increase attention and awareness of bolus acceptance. Pt required full assistance for feeding.  D/t her Cognitive decline, recommend a Dysphagia level 2(foods more broken down requiring less attention to lengthy mastication) w/ thin liquids; aspiration precautions - strict monitoring of thin liquids for any increased s/s of aspiration; negative sequelae of aspiration including pulmonary decline.  Recommend full assistance for feeding and reducing distractions during meals. Recommend positioning pt upright w/ extra pillows or towels rolls behind head during meals to aid in safer swallowing - PT MUST HAVE A HEAD FORWARD POSITION WHEN EATING/DRINKING.  ST services will f/u for diet toleration and family/pt education while admitted. Family members present grateful for education today.  SLP Visit Diagnosis: Dysphagia, unspecified (R13.10)    Aspiration Risk  Mild aspiration risk (Cognitive status)    Diet Recommendation   Dysphagia level 2 w/ thin liquids; aspiration precautions.   Medication Administration: Crushed with puree    Other  Recommendations Recommended Consults:  (dietician f/u) Oral Care Recommendations: Oral care BID;Staff/trained caregiver to provide oral care   Follow up Recommendations None      Frequency and Duration min 2x/week  1 week       Prognosis Prognosis for Safe Diet Advancement: Good Barriers to Reach Goals: Cognitive deficits  Swallow Study   General Date of Onset: 02/25/17 HPI: Pt  is a 83 y.o. female with a known history of Chronic atrial fibrillation, dementia,  hypertension, hypothyroidism and other medical problem is brought into the ED from [facility] nursing facility after she sustained a witnessed fall. Patient was in severe right hip pain and x-ray has revealed right femoral neck fracture. Pt s/p R hip hemiarthroplasty 02/25/17.   Type of Study: Bedside Swallow Evaluation Previous Swallow Assessment: none reported Diet Prior to this Study: Regular;Thin liquids Temperature Spikes Noted: No (wbc elevated) Respiratory Status: Nasal cannula (2 liters) History of Recent Intubation: No Behavior/Cognition: Alert;Cooperative;Confused;Distractible;Requires cueing Oral Cavity Assessment: Within Functional Limits Oral Care Completed by SLP: Recent completion by staff Oral Cavity - Dentition: Adequate natural dentition Vision: Functional for self-feeding Self-Feeding Abilities: Total assist;Needs set up;Needs assist Patient Positioning: Upright in bed (head forward; towel rolls/pillows behind head) Baseline Vocal Quality: Normal Volitional Cough: Strong Volitional Swallow: Able to elicit    Oral/Motor/Sensory Function Overall Oral Motor/Sensory Function: Within functional limits   Ice Chips Ice chips: Within functional limits Presentation: Spoon Other Comments: 3 trials    Thin Liquid Thin Liquid: Within functional limits Presentation: Straw;Cup (3x cup; 2x straw)    Nectar Thick Nectar Thick Liquid: Not tested   Honey Thick Honey Thick Liquid: Not tested   Puree Puree: Within functional limits Presentation: Spoon Other Comments: 4 trials   Solid   GO   Solid: Not tested       Nancy Fetter, SLP-Graduate Student Nancy Fetter 02/27/2017,3:14 PM   This information has been reviewed and agreed upon by this supervising clinician.  This patient note, response to treatment and overall treatment plan has been reviewed and this clinician agrees with the information provided.  02/27/17, 4:50 PM [phone] Jerilynn Som, MS, CCC-SLP

## 2017-02-27 NOTE — Discharge Summary (Addendum)
[facility] [place] [facility]   PATIENT NAME: Casey Sanford    MR#:  [id number]  DATE OF BIRTH:  [birth year]  DATE OF ADMISSION:  02/25/2017 ADMITTING PHYSICIAN: Ramonita Lab, MD  DATE OF DISCHARGE: 02/28/17 PRIMARY CARE PHYSICIAN: Jaclyn Shaggy, MD    ADMISSION DIAGNOSIS:  Closed fracture of right hip, initial encounter (HCC) [S72.001A]  DISCHARGE DIAGNOSIS:  Active Problems:   Hip fracture (HCC)   SECONDARY DIAGNOSIS:   Past Medical History:  Diagnosis Date  . A-fib (HCC)   . Atrial fibrillation (HCC) 2013   per family  . Dementia   . Hypertension   . Thyroid disease   . TIA (transient ischemic attack)     HOSPITAL COURSE:   HPI  Casey Sanford  is a 83 y.o. female with a known history of Chronic atrial fibrillation, dementia, hypertension, hypothyroidism and other medical problem is brought into the ED from [facility] nursing facility after she sustained a witnessed fall. Patient was in severe right hip pain and x-ray has revealed right femoral neck fracture. Hospitalist team is called to admit the patient. On call orthopedic surgeon Dr. Martha Clan was notified   #Acute neck pain  cervical x-rayNo acute abnormalities Pain management as needed  # Right hip pain-acute secondary to right femoral neck fracture Status post right hip arthroplasty on 02/25/2017 postop day #2 Pain management as needed Consult orthopedics discussed with Dr. Martha Clan, outpatient follow-up with the Ortho  after discharge Patient is wheelchair bound at at her baseline  #Chronic atrial fibrillation rate controlled On metoprolol Primary care physician has discontinued Eliquisas patient is at high risk for falls resume aspirin  #Hypothyroidism continue Synthroid  #Essential hypertension continue metoprolol Losartan discontinued by primary care physician   #Chronic history of dementia with sundowning Monitor patient closely, neuro  checks   #Hyperlipidemia continue Lipitor   DISCHARGE CONDITIONS:   fair  CONSULTS OBTAINED:  Treatment Team:  Juanell Fairly, MD   PROCEDURES Hip arthroplasty right-sided-  DRUG ALLERGIES:   Allergies  Allergen Reactions  . Sulfamethoxazole-Trimethoprim Rash  . Cephalexin Itching    DISCHARGE MEDICATIONS:   Current Discharge Medication List    START taking these medications   Details  HYDROcodone-acetaminophen (NORCO/VICODIN) 5-325 MG tablet Take 1-2 tablets by mouth every 6 (six) hours as needed for moderate pain. Qty: 30 tablet, Refills: 0    polyethylene glycol (MIRALAX / GLYCOLAX) packet Take 17 g by mouth daily as needed for mild constipation. Qty: 14 each, Refills: 0      CONTINUE these medications which have NOT CHANGED   Details  acetaminophen (TYLENOL) 500 MG tablet Take 500 mg by mouth every 6 (six) hours as needed (hip pain).    Emollient (AVEENO DAILY MOISTURIZING) LOTN Apply 1 application topically every Monday, Wednesday, and Friday at 8 PM.    levothyroxine (SYNTHROID, LEVOTHROID) 50 MCG tablet Take 1 tablet by mouth daily.    Propylene Glycol (SYSTANE BALANCE) 0.6 % SOLN Place 1 drop into the right eye 3 (three) times daily. For lubricant. Wait 3-5 minutes between different drops    risperiDONE (RISPERDAL) 1 MG tablet Take 1 tablet by mouth at bedtime.     senna-docusate (SENOKOT-S) 8.6-50 MG tablet Take 1 tablet by mouth at bedtime as needed for mild constipation. Qty: 30 tablet, Refills: 6    aspirin 81 MG chewable tablet Chew by mouth daily.    atorvastatin (LIPITOR) 40 MG tablet Take 1 tablet (40 mg total) by mouth daily at  6 PM. Qty: 30 tablet, Refills: 6    docusate sodium (COLACE) 100 MG capsule Take 100 mg by mouth every other day.     guaifenesin (ROBITUSSIN) 100 MG/5ML syrup Take 100 mg by mouth every 6 (six) hours as needed for cough.    hydrocortisone cream 1 % Apply 1 application topically every 12 (twelve) hours as  needed for itching.    metoprolol tartrate (LOPRESSOR) 25 MG tablet Take 1 tablet (25 mg total) by mouth 2 (two) times daily. Qty: 60 tablet, Refills: 6    Potassium Chloride CR (MICRO-K) 8 MEQ CPCR capsule CR Take 1 capsule by mouth daily.      STOP taking these medications     apixaban (ELIQUIS) 5 MG TABS tablet      furosemide (LASIX) 20 MG tablet      losartan (COZAAR) 50 MG tablet          DISCHARGE INSTRUCTIONS:   Follow-up with primary care physician at the facility in 2-3 days Follow-up with orthopedics in 2 weeks  DIET:  Cardiac diet  DISCHARGE CONDITION:  Fair  ACTIVITY:  PER ORTHO/PT  OXYGEN:  Home Oxygen: No.   Oxygen Delivery: room air  DISCHARGE LOCATION:  nursing home   If you experience worsening of your admission symptoms, develop shortness of breath, life threatening emergency, suicidal or homicidal thoughts you must seek medical attention immediately by calling 911 or calling your MD immediately  if symptoms less severe.  You Must read complete instructions/literature along with all the possible adverse reactions/side effects for all the Medicines you take and that have been prescribed to you. Take any new Medicines after you have completely understood and accpet all the possible adverse reactions/side effects.   Please note  You were cared for by a hospitalist during your hospital stay. If you have any questions about your discharge medications or the care you received while you were in the hospital after you are discharged, you can call the unit and asked to speak with the hospitalist on call if the hospitalist that took care of you is not available. Once you are discharged, your primary care physician will handle any further medical issues. Please note that NO REFILLS for any discharge medications will be authorized once you are discharged, as it is imperative that you return to your primary care physician (or establish a relationship with a  primary care physician if you do not have one) for your aftercare needs so that they can reassess your need for medications and monitor your lab values.     Today  Chief Complaint  Patient presents with  . Fall   Patient is reporting right hip pain with movements  ROS:  CONSTITUTIONAL: Denies fevers, chills. Denies any fatigue, weakness.  EYES: Denies blurry vision, double vision, eye pain. EARS, NOSE, THROAT: Denies tinnitus, ear pain, hearing loss. RESPIRATORY: Denies cough, wheeze, shortness of breath.  CARDIOVASCULAR: Denies chest pain, palpitations, edema.  GASTROINTESTINAL: Denies nausea, vomiting, diarrhea, abdominal pain. Denies bright red blood per rectum. GENITOURINARY: Denies dysuria, hematuria. ENDOCRINE: Denies nocturia or thyroid problems. HEMATOLOGIC AND LYMPHATIC: Denies easy bruising or bleeding. SKIN: Denies rash or lesion. MUSCULOSKELETAL: Right hip pain.  NEUROLOGIC: Denies paralysis, paresthesias.  PSYCHIATRIC: Denies anxiety or depressive symptoms.   VITAL SIGNS:  Blood pressure (!) 133/51, pulse (!) 118, temperature 98.1 F (36.7 C), temperature source Oral, resp. rate 19, height  (1.6 m), weight 60.1 kg (132 lb 9.6 oz), SpO2 100 %.  I/O:  Intake/Output Summary (Last 24 hours) at 02/27/17 1614 Last data filed at 02/27/17 0524  Gross per 24 hour  Intake             1575 ml  Output              100 ml  Net             1475 ml    PHYSICAL EXAMINATION:  GENERAL:  83 y.o.-year-old patient lying in the bed with no acute distress.  EYES: Pupils equal, round, reactive to light and accommodation. No scleral icterus. Extraocular muscles intact.  HEENT: Head atraumatic, normocephalic. Oropharynx and nasopharynx clear.  NECK:  Supple, no jugular venous distention. No thyroid enlargement, no tenderness.  LUNGS: Normal breath sounds bilaterally, no wheezing, rales,rhonchi or crepitation. No use of accessory muscles of respiration.  CARDIOVASCULAR: S1,  S2 normal. No murmurs, rubs, or gallops.  ABDOMEN: Soft, non-tender, non-distended. Bowel sounds present. No organomegaly or mass.  EXTREMITIES: Right hip is tender  No pedal edema, cyanosis, or clubbing.  NEUROLOGIC: Cranial nerves grossly intact  PSYCHIATRIC: The patient is alert and oriented x 3.  SKIN: No obvious rash, lesion, or ulcer.   DATA REVIEW:   CBC  Recent Labs Lab 02/27/17 0617  WBC 14.4*  HGB 10.8*  HCT 31.2*  PLT 139*    Chemistries   Recent Labs Lab 02/27/17 0617  NA 137  K 3.8  CL 109  CO2 22  GLUCOSE 133*  BUN 9  CREATININE 0.65  CALCIUM 8.2*    Cardiac Enzymes  Recent Labs Lab 02/25/17 0645  TROPONINI <0.03    Microbiology Results  Results for orders placed or performed during the hospital encounter of 02/25/17  Surgical pcr screen     Status: None   Collection Time: 02/25/17 11:28 AM  Result Value Ref Range Status   MRSA, PCR NEGATIVE NEGATIVE Final   Staphylococcus aureus NEGATIVE NEGATIVE Final    Comment: (NOTE) The Xpert SA Assay (FDA approved for NASAL specimens in patients 422 years of age and older), is one component of a comprehensive surveillance program. It is not intended to diagnose infection nor to guide or monitor treatment.     RADIOLOGY:  Dg Cervical Spine 2 Or 3 Views  Result Date: 02/27/2017 CLINICAL DATA:  Patient reports diffuse neck pain onset today. No known injury to neck. No previous injury. No previous surgery. EXAM: CERVICAL SPINE - 2-3 VIEW COMPARISON:  CT of the head 07/21/2016 FINDINGS: Bones appear osteopenic. There is normal alignment C1 through T1. No acute fracture or subluxation. Prevertebral soft tissues have a normal appearance. Lung apices are clear. There is atherosclerotic calcification of the carotid bulb regions. IMPRESSION: 1. Normal alignment.  No evidence for acute abnormality. 2. Carotid bulb atherosclerosis. Electronically Signed   By: Norva PavlovElizabeth  Brown M.D.   On: 02/27/2017 13:33   Ct Head  Wo Contrast  Result Date: 02/25/2017 CLINICAL DATA:  Unwitnessed fall.  History of dementia. EXAM: CT HEAD WITHOUT CONTRAST TECHNIQUE: Contiguous axial images were obtained from the base of the skull through the vertex without intravenous contrast. COMPARISON:  Head CT dated 07/21/2016 and brain MRI dated 07/22/2016 FINDINGS: Brain: There is mild generalized age-related parenchymal atrophy with commensurate dilatation of the ventricles and sulci. Chronic small vessel ischemic changes again noted within the bilateral periventricular white matter. Small old lacunar infarct noted in the left basal ganglia. Old infarct within the left occipital lobe with associated encephalomalacia. There is no mass, hemorrhage, edema  or other evidence of acute parenchymal abnormality. No extra-axial hemorrhage. Vascular: There are chronic calcified atherosclerotic changes of the large vessels at the skull base. No unexpected hyperdense vessel. Skull: Normal. Negative for fracture or focal lesion. Sinuses/Orbits: No acute finding. Other: None. IMPRESSION: 1. No acute findings. No intracranial mass, hemorrhage or edema. No skull fracture. 2. Chronic ischemic changes, as detailed above. Electronically Signed   By: Bary Richard M.D.   On: 02/25/2017 07:34   Dg Chest Port 1 View  Result Date: 02/25/2017 CLINICAL DATA:  Unwitnessed fall. EXAM: PORTABLE CHEST 1 VIEW COMPARISON:  Radiograph of February 05, 2015. FINDINGS: Stable cardiomediastinal silhouette. Atherosclerosis of thoracic aorta is noted. No pneumothorax or pleural effusion is noted. Right lung is clear. Minimal left basilar subsegmental atelectasis or scarring is noted. Bony thorax is unremarkable. IMPRESSION: Aortic atherosclerosis. Minimal left basilar subsegmental atelectasis or scarring. Electronically Signed   By: Lupita Raider, M.D.   On: 02/25/2017 07:22   Dg Hip Port Unilat With Pelvis 1v Right  Result Date: 02/25/2017 CLINICAL DATA:  Post right hip arthroplasty.  EXAM: DG HIP (WITH OR WITHOUT PELVIS) 1V PORT RIGHT COMPARISON:  Preoperative radiographs earlier this day. FINDINGS: Right hip arthroplasty in expected alignment. No periprosthetic lucency or fracture. Recent postsurgical change includes soft tissue air and edema with lateral skin staples. IMPRESSION: Right hip arthroplasty without immediate postoperative complication. Electronically Signed   By: Rubye Oaks M.D.   On: 02/25/2017 16:51   Dg Hip Unilat W Or Wo Pelvis 2-3 Views Right  Result Date: 02/25/2017 CLINICAL DATA:  Right hip pain after fall. EXAM: DG HIP (WITH OR WITHOUT PELVIS) 2-3V RIGHT COMPARISON:  None. FINDINGS: Moderately displaced fracture is seen involving the proximal right femoral neck. Left hip appears normal. IMPRESSION: Moderately displaced proximal right femoral neck fracture. Electronically Signed   By: Lupita Raider, M.D.   On: 02/25/2017 07:23    EKG:   Orders placed or performed during the hospital encounter of 02/25/17  . ED EKG  . ED EKG  . EKG 12-Lead  . EKG 12-Lead      Management plans discussed with the patient, family and they are in agreement.  CODE STATUS:     Code Status Orders        Start     Ordered   02/25/17 1214  Do not attempt resuscitation (DNR)  Continuous    Question Answer Comment  In the event of cardiac or respiratory ARREST Do not call a "code blue"   In the event of cardiac or respiratory ARREST Do not perform Intubation, CPR, defibrillation or ACLS   In the event of cardiac or respiratory ARREST Use medication by any route, position, wound care, and other measures to relive pain and suffering. May use oxygen, suction and manual treatment of airway obstruction as needed for comfort.   Comments rn may pronounce      02/25/17 1213    Code Status History    Date Active Date Inactive Code Status Order ID Comments User Context   02/25/2017 11:31 AM 02/25/2017 12:13 PM Full Code [id number]  Ramonita Lab, MD Inpatient   07/21/2016   3:20 PM 07/23/2016  5:07 PM Full Code [id number]  Auburn Bilberry, MD Inpatient   06/09/2015 12:19 PM 06/09/2015  5:49 PM Full Code [id number]  Schnier, Latina Craver, MD Inpatient    Advance Directive Documentation     Most Recent Value  Type of Advance Directive  Healthcare Power of Attorney  Pre-existing  out of facility DNR order (yellow form or pink MOST form)  -  "MOST" Form in Place?  -      TOTAL TIME TAKING CARE OF THIS PATIENT:  45  minutes.   Note: This dictation was prepared with Dragon dictation along with smaller phrase technology. Any transcriptional errors that result from this process are unintentional.   @  on 02/27/2017 at 4:14 PM  Between 7am to 6pm - Pager - [phone]  After 6pm go to [email or web address] - password [email or web address] [facility]  [place] [facility]  Office  [phone]  CC: Primary care physician; Jaclyn Shaggy, MD

## 2017-02-27 NOTE — Progress Notes (Signed)
OT Cancellation Note  Patient Details Name: Casey BakerHazel H Wickard MRN: [id number] DOB: Aug 02, 1922   Cancelled Treatment:    Reason Eval/Treat Not Completed: Medical issues which prohibited therapy. Attempted to evaluate, per PT, daughters present in room requesting to see MD. MD entered. Per note MD ordering cervical collar and cervical x-ray as pt is having acute neck pain. Will hold OT evaluation and continue to follow acutely in order to re-attempt OT evaluation once x-rays have been done and POC has been updated.   Richrd PrimeJamie Stiller, MPH, MS, OTR/L ascom [phone] 02/27/17, 11:26 AM

## 2017-02-27 NOTE — Progress Notes (Signed)
[facility] [place] [facility]   PATIENT NAME: Casey Sanford    MR#:  [id number]  DATE OF BIRTH:  10-08-1922  SUBJECTIVE:  CHIEF COMPLAINT:  Patient is reporting neck pain. Decreased by mouth intake. Daughters at bedside.  REVIEW OF SYSTEMS:  CONSTITUTIONAL: No fever, fatigue or weakness. Poor by mouth intake according to the daughters EYES: No blurred or double vision.  EARS, NOSE, AND THROAT: No tinnitus or ear pain. Reporting neck pain RESPIRATORY: No cough, shortness of breath, wheezing or hemoptysis.  CARDIOVASCULAR: No chest pain, orthopnea, edema.  GASTROINTESTINAL: No nausea, vomiting, diarrhea or abdominal pain.  GENITOURINARY: No dysuria, hematuria.  ENDOCRINE: No polyuria, nocturia,  HEMATOLOGY: No anemia, easy bruising or bleeding SKIN: No rash or lesion. MUSCULOSKELETAL: right hip pain.   NEUROLOGIC: No tingling, numbness, weakness.  PSYCHIATRY: No anxiety or depression.   DRUG ALLERGIES:   Allergies  Allergen Reactions  . Sulfamethoxazole-Trimethoprim Rash  . Cephalexin Itching    VITALS:  Blood pressure (!) 126/48, pulse 78, temperature 98.1 F (36.7 C), temperature source Oral, resp. rate 19, height 5\' 3"  (1.6 m), weight 60.1 kg (132 lb 9.6 oz), SpO2 98 %.  PHYSICAL EXAMINATION:  GENERAL:  83 y.o.-year-old patient lying in the bed with no acute distress.  EYES: Pupils equal, round, reactive to light and accommodation. No scleral icterus. Extraocular muscles intact.  HEENT: Head atraumatic, normocephalic. Oropharynx and nasopharynx clear.  NECK:  Back of the neck is tender range of motion decreased , no jugular venous distention. No thyroid enlargement, no tenderness.  LUNGS: Normal breath sounds bilaterally, no wheezing, rales,rhonchi or crepitation. No use of accessory muscles of respiration.  CARDIOVASCULAR: S1, S2 normal. No murmurs, rubs, or gallops.  ABDOMEN: Soft, nontender, nondistended. Bowel sounds present. No  organomegaly or mass.  EXTREMITIES: No pedal edema, cyanosis, or clubbing.  NEUROLOGIC: Awake and alert, right hip is tender and in clear dressing PSYCHIATRIC: The patient is alert and oriented x2-3.  SKIN: No obvious rash, lesion, or ulcer.    LABORATORY PANEL:   CBC  Recent Labs Lab 02/27/17 0617  WBC 14.4*  HGB 10.8*  HCT 31.2*  PLT 139*   ------------------------------------------------------------------------------------------------------------------  Chemistries   Recent Labs Lab 02/27/17 0617  NA 137  K 3.8  CL 109  CO2 22  GLUCOSE 133*  BUN 9  CREATININE 0.65  CALCIUM 8.2*   ------------------------------------------------------------------------------------------------------------------  Cardiac Enzymes  Recent Labs Lab 02/25/17 0645  TROPONINI <0.03   ------------------------------------------------------------------------------------------------------------------  RADIOLOGY:  Dg Hip Port Unilat With Pelvis 1v Right  Result Date: 02/25/2017 CLINICAL DATA:  Post right hip arthroplasty. EXAM: DG HIP (WITH OR WITHOUT PELVIS) 1V PORT RIGHT COMPARISON:  Preoperative radiographs earlier this day. FINDINGS: Right hip arthroplasty in expected alignment. No periprosthetic lucency or fracture. Recent postsurgical change includes soft tissue air and edema with lateral skin staples. IMPRESSION: Right hip arthroplasty without immediate postoperative complication. Electronically Signed   By: Rubye OaksMelanie  Ehinger M.D.   On: 02/25/2017 16:51    EKG:   Orders placed or performed during the hospital encounter of 02/25/17  . ED EKG  . ED EKG  . EKG 12-Lead  . EKG 12-Lead    ASSESSMENT AND PLAN:     Casey OkaHazel Verbeke  is a 83 y.o. female with a known history of Chronic atrial fibrillation, dementia, hypertension, hypothyroidism and other medical problem [facility] after she sustained a witnessed fall. Patient was in severe  right hip  pain and x-ray has revealed right femoral neck fracture.   #Acute neck pain Cervical collar, cervical x-ray Pain management as needed  # Right hip pain-acute secondary to right femoral neck fracture Status post right hip arthroplasty on 02/25/2017 postop day #2 Pain management as needed Consult orthopedics discussed with Dr. Martha Clan Patient is wheelchair bound at at her baseline  #Chronic atrial fibrillation rate controlled On metoprolol Primary care physician has discontinued Eliquis as patient is at high risk for falls resume aspirin  #Hypothyroidism continue Synthroid  #Essential hypertension continue metoprolol Losartan discontinued by primary care physician   #Chronic history of dementia with sundowning Monitor patient closely, neuro checks   #Hyperlipidemia continue Lipitor   All the records are reviewed and case discussed with Care Management/Social Workerr. Management plans discussed with the patient, family and they are in agreement.  CODE STATUS: dnr  TOTAL TIME TAKING CARE OF THIS PATIENT: 35  minutes.   POSSIBLE D/C IN 2  DAYS, DEPENDING ON CLINICAL CONDITION.  Note: This dictation was prepared with Dragon dictation along with smaller phrase technology. Any transcriptional errors that result from this process are unintentional.   Ramonita Lab M.D on 02/27/2017 at 11:10 AM  Between 7am to 6pm - Pager - [phone] After 6pm go to [email or web address] - password [email or web address] [facility]  [facility]  Office  [phone]  CC: Primary care physician; Jaclyn Shaggy, MD

## 2017-02-27 NOTE — Plan of Care (Signed)
Problem: SLP Dysphagia Goals Goal: Misc Dysphagia Goal Pt will safely tolerate po diet of least restrictive consistency w/ no overt s/s of aspiration noted by Staff/pt/family x3 sessions.    
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
## 2017-02-27 NOTE — Progress Notes (Signed)
  Subjective:  POD #2 s/p right hip hemiarthroplasty.  Patient reports pain as mild.  Family reports that she has trouble swallowing today.  Objective:   VITALS:   Vitals:   02/26/17 1538 02/26/17 2014 02/26/17 2057 02/27/17 0850  BP: (!) 125/44 (!) 158/59 (!) 152/56 (!) 126/48  Pulse: 74 83 85 78  Resp: 17  18 19   Temp: 98.2 F (36.8 C)  98.3 F (36.8 C) 98.1 F (36.7 C)  TempSrc: Axillary  Oral Oral  SpO2: 100% 100% 100% 98%  Weight:      Height:        PHYSICAL EXAM: Right lower extremity: Her bandage has scant drainage.  Her thigh compartments are soft and compressible. There is no erythema or drainage from her incision.  She has mild ecchymosis over the right hip. Distally she has intact and has intact motor function. She can dorsiflex and plantarflex her ankle and flex and extend her toes. She has TED stockings and foot pumps in place.  LABS  Results for orders placed or performed during the hospital encounter of 02/25/17 (from the past 24 hour(s))  CBC     Status: Abnormal   Collection Time: 02/27/17  6:17 AM  Result Value Ref Range   WBC 14.4 (H) 3.6 - 11.0 K/uL   RBC 2.71 (L) 3.80 - 5.20 MIL/uL   Hemoglobin 10.8 (L) 12.0 - 16.0 g/dL   HCT 16.131.2 (L) 09.635.0 - 04.547.0 %   MCV 115.4 (H) 80.0 - 100.0 fL   MCH 39.7 (H) 26.0 - 34.0 pg   MCHC 34.5 32.0 - 36.0 g/dL   RDW 40.914.0 81.111.5 - 91.414.5 %   Platelets 139 (L) 150 - 440 K/uL  Basic metabolic panel     Status: Abnormal   Collection Time: 02/27/17  6:17 AM  Result Value Ref Range   Sodium 137 135 - 145 mmol/L   Potassium 3.8 3.5 - 5.1 mmol/L   Chloride 109 101 - 111 mmol/L   CO2 22 22 - 32 mmol/L   Glucose, Bld 133 (H) 65 - 99 mg/dL   BUN 9 6 - 20 mg/dL   Creatinine, Ser 7.820.65 0.44 - 1.00 mg/dL   Calcium 8.2 (L) 8.9 - 10.3 mg/dL   GFR calc non Af Amer >60 >60 mL/min   GFR calc Af Amer >60 >60 mL/min   Anion gap 6 5 - 15    Dg Hip Port Unilat With Pelvis 1v Right  Result Date: 02/25/2017 CLINICAL DATA:  Post right hip  arthroplasty. EXAM: DG HIP (WITH OR WITHOUT PELVIS) 1V PORT RIGHT COMPARISON:  Preoperative radiographs earlier this day. FINDINGS: Right hip arthroplasty in expected alignment. No periprosthetic lucency or fracture. Recent postsurgical change includes soft tissue air and edema with lateral skin staples. IMPRESSION: Right hip arthroplasty without immediate postoperative complication. Electronically Signed   By: Rubye OaksMelanie  Ehinger M.D.   On: 02/25/2017 16:51    Assessment/Plan: 2 Days Post-Op   Active Problems:   Hip fracture (HCC)   Patient is ordered for C-spine x-rays as part of a workup for her swallowing workup. Speech and swallow consult ordered.  Patient is removed from the orthopedic standpoint. Possible discharge tomorrow to rehabilitation pending speech and swallow evaluation and medical clearance. Patient is weightbearing as tolerated on the right lower extremity. She should continue posterior hip precautions. She will remain on Lovenox for DVT prophylaxis.   Juanell FairlyKRASINSKI, Tremain Rucinski , MD 02/27/2017, 11:53 AM

## 2017-02-27 NOTE — Progress Notes (Signed)
PT Cancellation Note  Patient Details Name: Casey Sanford MRN: [id number] DOB: 1923/04/26   Cancelled Treatment:    Reason Eval/Treat Not Completed: Other (comment).  Upon PT entering pt's room, pt's daughter expressing concerns regarding pt's status and pt's new c/o neck pain today (pt's other daughter arrived during this conversation).  Nursing immediately notified and MD Gouru also notified (regarding concerns) who went to see pt and pt's family.  Per note MD recommending cervical collar and new orders for cervical x-ray (d/t acute neck pain).  Will hold PT at this time and re-attempt PT eval at a later date/time as medically appropriate pending results of imaging.  Hendricks LimesEmily Hershall Benkert, PT 02/27/17, 11:41 AM [phone]

## 2017-02-27 NOTE — Progress Notes (Signed)
Physical Therapy Treatment Patient Details Name: Casey Sanford MRN: [id number] DOB: 1922-08-16 Today's Date: 02/27/2017    History of Present Illness Pt is a 83 y.o. female presenting to hospital s/p fall with R hip pain.  Imaging showing moderately displaced proximal R femoral neck fx.  Pt s/p R hip hemiarthroplasty 02/25/17.  PMH includes dementia, a-fib, htn, thyroid disease, and TIA.    PT Comments    Upon PT entering room, nursing reporting pt just refused to take medications.  Cervical imaging negative for fx.  Pt requiring a lot of encouragement to participate in therapy during session.  Pt's daughter present and verbalizing that she wanted pt to do as much as possible with therapy even if pt says no d/t pt has dementia and needs extra encouragement to participate and get better (pt's other daughter came during session and verbalizing same feelings).  Attempted LE ex's in bed with pt but limited PROM to [facility] achieved d/t pt resisting; also increased time required to perform activities d/t pt requiring encouragement for each activity.  D/t pt resisting movement in general, pt not safe to attempt OOB mobility at this time.  Will re-attempt PT treatment tomorrow and attempt OOB activities as able.    Follow Up Recommendations  SNF     Equipment Recommendations  Other (comment) (pt already has recommended manual w/c)    Recommendations for Other Services       Precautions / Restrictions Precautions Precautions: Fall;Posterior Hip Restrictions Weight Bearing Restrictions: Yes RLE Weight Bearing: Weight bearing as tolerated    Mobility  Bed Mobility               General bed mobility comments: Deferred  Transfers                 General transfer comment: Deferred  Ambulation/Gait             General Gait Details: Pt non-ambulatory baseline (gait deferred)   Stairs            Wheelchair Mobility    Modified Rankin (Stroke Patients Only)        Balance                                            Cognition Arousal/Alertness: Awake/alert Behavior During Therapy: WFL for tasks assessed/performed Overall Cognitive Status: History of cognitive impairments - at baseline (Oriented to self)                                        Exercises Total Joint Exercises Short Arc Quad: PROM;AAROM;Strengthening;Both;10 reps;Supine Heel Slides: PROM;AAROM;Strengthening;Both;10 reps;Supine Hip ABduction/ADduction: PROM;AAROM;Strengthening;Both;10 reps;Supine    General Comments   Nursing cleared pt for participation in physical therapy.  Pt agreeable to PT session.      Pertinent Vitals/Pain Pain Assessment: Faces Faces Pain Scale: Hurts a little bit (2/10 at rest; 6/10 with movement) Pain Location: R hip Pain Descriptors / Indicators: Sore;Tender;Sharp;Shooting Pain Intervention(s): Limited activity within patient's tolerance;Monitored during session;Repositioned    Home Living                      Prior Function            PT Goals (current goals can now be found in the  care plan section) Acute Rehab PT Goals Patient Stated Goal: to have less pain PT Goal Formulation: With patient/family Time For Goal Achievement: 03/12/17 Potential to Achieve Goals: Fair Progress towards PT goals: Progressing toward goals (with strengthening/ROM)    Frequency    BID      PT Plan Current plan remains appropriate    Co-evaluation              AM-PAC PT "6 Clicks" Daily Activity  Outcome Measure  Difficulty turning over in bed (including adjusting bedclothes, sheets and blankets)?: Unable Difficulty moving from lying on back to sitting on the side of the bed? : Unable Difficulty sitting down on and standing up from a chair with arms (e.g., wheelchair, bedside commode, etc,.)?: Unable Help needed moving to and from a bed to chair (including a wheelchair)?: Total Help needed  walking in hospital room?: Total Help needed climbing 3-5 steps with a railing? : Total 6 Click Score: 6    End of Session Equipment Utilized During Treatment: Oxygen Activity Tolerance: Patient limited by pain Patient left: in bed;with call bell/phone within reach;with bed alarm set;with family/visitor present;with SCD's reapplied (SCD machine plug bent (removed SCD attachment to pt and nursing notified); B heels elevated via pillow/towel roll; hip abduction pillow in place) Nurse Communication: Mobility status;Precautions;Weight bearing status PT Visit Diagnosis: Other abnormalities of gait and mobility (R26.89);Muscle weakness (generalized) (M62.81);History of falling (Z91.81);Pain Pain - Right/Left: Right Pain - part of body: Hip     Time: 1540-1610 PT Time Calculation (min) (ACUTE ONLY): 30 min  Charges:  $Therapeutic Exercise: 23-37 mins                    G CodesHendricks Limes:       Vibhav Waddill, PT 02/27/17, 4:30 PM [phone]

## 2017-02-28 ENCOUNTER — Encounter: Payer: Self-pay | Admitting: Orthopedic Surgery

## 2017-02-28 DIAGNOSIS — R410 Disorientation, unspecified: Secondary | ICD-10-CM

## 2017-02-28 DIAGNOSIS — F0390 Unspecified dementia without behavioral disturbance: Secondary | ICD-10-CM

## 2017-02-28 DIAGNOSIS — F03B0 Unspecified dementia, moderate, without behavioral disturbance, psychotic disturbance, mood disturbance, and anxiety: Secondary | ICD-10-CM

## 2017-02-28 LAB — CBC
HEMATOCRIT: 26.4 % — AB (ref 35.0–47.0)
Hemoglobin: 9.3 g/dL — ABNORMAL LOW (ref 12.0–16.0)
MCH: 39.7 pg — ABNORMAL HIGH (ref 26.0–34.0)
MCHC: 35.1 g/dL (ref 32.0–36.0)
MCV: 113.2 fL — ABNORMAL HIGH (ref 80.0–100.0)
Platelets: 132 10*3/uL — ABNORMAL LOW (ref 150–440)
RBC: 2.34 MIL/uL — AB (ref 3.80–5.20)
RDW: 14.1 % (ref 11.5–14.5)
WBC: 10.9 10*3/uL (ref 3.6–11.0)

## 2017-02-28 LAB — SURGICAL PATHOLOGY

## 2017-02-28 MED ORDER — HALOPERIDOL LACTATE 5 MG/ML IJ SOLN: 0.5000 mg | Freq: Three times a day (TID) | INTRAMUSCULAR | Status: DC | PRN
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
## 2017-02-28 NOTE — Progress Notes (Signed)
Patient is alert to self only. Answers questions with one  Word. Repositioned Q2H. Refused food and meds today. IV fluids running. External cath intact. Incont of stool x 1.  Patient on low bed and alarm on for safety. Abductor pillow in place.
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
## 2017-02-28 NOTE — Progress Notes (Signed)
PT Cancellation Note  Patient Details Name: Casey Sanford MRN: [id number] DOB: [birth year]   Cancelled Treatment:    Reason Eval/Treat Not Completed: Other (comment).  Palliative care consult ordered.  Pt soundly sleeping in bed upon PT and OT arrival (pt did not sleep well last night per nursing).  [name] verbalizing concerns regarding pt's current status and prognosis (both reporting having good discussion with MD this morning regarding concerns); both daughter's also verbalizing questions/concerns regarding continuing therapy (pt also not eating and not taking medications).  Therapists (OT and PT) listened to daughter's concerns and educated both that Palliative Care will come to facilitate conversation to help determine POC.  Per discussion with both daughter's, will hold PT/OT until Palliative Care comes to see pt and pt's daughter's and POC is determined (nursing notified).  Hendricks LimesEmily Symantha Steeber, PT 02/28/17, 11:36 AM [phone]

## 2017-02-28 NOTE — Progress Notes (Signed)
Patient is sleepy, open your eyes and answers questions with one word. Goes right back to sleep. Patient was awake all night. Patient not appropriate to give meds. MD aware. Palliative consult order in. MD spoke with family in room. IV fluids still running. External cath in place.

## 2017-02-28 NOTE — Progress Notes (Addendum)
  Subjective:  Postoperative day #3 status post right hip hemiarthroplasty. Patient lethargic.  Patient has dementia.  Patient's family at the bedside.  They are very concerned about the patient.   They say hospitalist has ordered palliative care consult.  Patient reported to be refusing to eat or do PT.  Objective:   VITALS:   Vitals:   02/27/17 1456 02/27/17 1509 02/27/17 2052 02/28/17 0900  BP: (!) 133/51 (!) 133/51 (!) 156/74 105/65  Pulse: 72 (!) 118 (!) 118 90  Resp:   12 14  Temp:  98.1 F (36.7 C)  98.1 F (36.7 C)  TempSrc:  Oral  Axillary  SpO2:  100% 100% 98%  Weight:      Height:        PHYSICAL EXAM: Right lower extremity: Neurovascular intact Sensation intact distally Intact pulses distally Dorsiflexion/Plantar flexion intact Incision: scant drainage No cellulitis present Compartment soft  LABS  Results for orders placed or performed during the hospital encounter of 02/25/17 (from the past 24 hour(s))  CBC     Status: Abnormal   Collection Time: 02/28/17  5:50 AM  Result Value Ref Range   WBC 10.9 3.6 - 11.0 K/uL   RBC 2.34 (L) 3.80 - 5.20 MIL/uL   Hemoglobin 9.3 (L) 12.0 - 16.0 g/dL   HCT 16.126.4 (L) 09.635.0 - 04.547.0 %   MCV 113.2 (H) 80.0 - 100.0 fL   MCH 39.7 (H) 26.0 - 34.0 pg   MCHC 35.1 32.0 - 36.0 g/dL   RDW 40.914.1 81.111.5 - 91.414.5 %   Platelets 132 (L) 150 - 440 K/uL    Dg Cervical Spine 2 Or 3 Views  Result Date: 02/27/2017 CLINICAL DATA:  Patient reports diffuse neck pain onset today. No known injury to neck. No previous injury. No previous surgery. EXAM: CERVICAL SPINE - 2-3 VIEW COMPARISON:  CT of the head 07/21/2016 FINDINGS: Bones appear osteopenic. There is normal alignment C1 through T1. No acute fracture or subluxation. Prevertebral soft tissues have a normal appearance. Lung apices are clear. There is atherosclerotic calcification of the carotid bulb regions. IMPRESSION: 1. Normal alignment.  No evidence for acute abnormality. 2. Carotid bulb  atherosclerosis. Electronically Signed   By: Norva PavlovElizabeth  Brown M.D.   On: 02/27/2017 13:33    Assessment/Plan: 3 Days Post-Op   Active Problems:   Hip fracture (HCC)  Patient is stable from orthopedic standpoint. Continue posterior hip precautions. She may weight-bear as tolerated on the right lower extremity. Patient is comfortable currently. She should continue Lovenox for DVT prophylaxis 4 weeks upon discharge. Patient will follow up with me in 10-14 days postop for wound check and staple removal.   Patient lethargic and is not thriving post-op.  I agree with palliative care consult.   I have tried to prepare family of possible continued decline.  They understand that mortality in a 83 y/o after hip surgery is possible.   Patient showing signs of failure to thrive post-op.   Will continue to follow.     Juanell FairlyKRASINSKI, Abree Romick , MD 02/28/2017, 11:50 AM

## 2017-02-28 NOTE — Progress Notes (Signed)
OT Cancellation Note  Patient Details Name: Aldean BakerHazel H Hollenbeck MRN: [id number] DOB: 06/09/1923   Cancelled Treatment:    Reason Eval/Treat Not Completed: Other (comment). Palliative care consult ordered.  Pt soundly sleeping in bed upon PT and OT arrival (pt did not sleep well last night per nursing).  [name] verbalizing concerns regarding pt's current status and prognosis (both reporting having good discussion with MD this morning regarding concerns); both daughter's also verbalizing questions/concerns regarding continuing therapy (pt also not eating and not taking medications).  Therapists (OT and PT) listened to daughter's concerns and educated both that Palliative Care will come to facilitate conversation to help determine POC.  Per discussion with both daughter's, will hold PT/OT until Palliative Care comes to see pt and pt's daughter's and POC is determined (nursing notified).  Richrd PrimeJamie Stiller, MPH, MS, OTR/L ascom [phone] 02/28/17, 11:47 AM

## 2017-02-28 NOTE — Progress Notes (Signed)
Per MD patient is not stable for D/C today and palliative consult has been ordered. Patient can't go to [facility] for short term rehab under hospice care. Patient can go to [facility] under palliative care. RN and MD aware of above. [facility] at [facility] is aware of above.   Baker Hughes IncorporatedBailey Jesyca Weisenburger, LCSW [phone]

## 2017-02-28 NOTE — Consult Note (Signed)
[facility] Face-to-Face Psychiatry Consult   Reason for Consult:  Consult for this 83 year old woman in the hospital recovering from hip fracture. Consult regarding capacity Referring Physician:  Gouru Patient Identification: Casey Sanford MRN:  [id number] Principal Diagnosis: Acute delirium Diagnosis:   Patient Active Problem List   Diagnosis Date Noted  . Acute delirium [R41.0] 02/28/2017  . Moderate dementia [F03.90] 02/28/2017  . Hip fracture ([place]) [S72.009A] 02/25/2017  . Right sided weakness [R53.1] 07/23/2016  . Expressive aphasia [R47.01] 07/23/2016  . Hyperlipidemia [E78.5] 07/23/2016  . Atrial fibrillation ([place]) [I48.91] 07/23/2016  . Generalized weakness [R53.1] 07/23/2016  . ICAO (internal carotid artery occlusion), right [I65.21] 07/23/2016  . Acute ischemic left PCA stroke ([place]) [I63.532] 07/23/2016  . CVA (cerebral vascular accident) ([place]) [I63.9] 07/21/2016  . Atherosclerosis of native arteries of the extremities with ulceration ([place]) [I70.25] 06/29/2016  . Chronic ulcer of right great toe ([place]) [L97.519] 06/29/2016  . Essential hypertension [I10] 06/29/2016  . Hypothyroidism [E03.9] 06/29/2016    Total Time spent with patient: 45 minutes  Subjective:   Casey Sanford is a 83 y.o. female patient admitted with patient not able to give any information.  HPI:  Examine patient and spoke with her daughters were present in the room. Chart reviewed. 83 year old woman with a history of identified depression who is in the hospital recovering after a hip fracture. Patient reportedly began refusing medications which prompted hospitalist to request a capacity evaluation. Today I found the patient to be essentially unresponsive. She appears to be deeply asleep but does not respond to loud verbal statements of her name or gentle shaking of her shoulder except to open her eyes slightly and grown. Not able to make any kind of meaningful response. Sister's report that the patient's condition  mentally has deteriorated significantly today. Yesterday they report that she was able to talk although she was very confused and was appearing to have visual hallucinations. They report that today she has been unresponsive to them as well. She was not able to teach when it was attempted to feed her. Patient with multiple medical problems. Recovering from repair of hip fracture. Family reports that prior to her hospitalization she clearly had a degree of dementia but was able to recognize family members and caregivers and interacted in an appropriate manner although she was not able to manage medications or her own medical care. She does not have a history of noncompliance or refusal or oppositionality.  Social history: Patient had been living in an assisted living. Decision is still pending about appropriate living situation at discharge this time. Apparently at least one of the daughters is a healthcare power of attorney according to the chart.  Medical history: Recovering from hip fracture. Multiple medical problems including a history of stroke history of atherosclerotic disease hypertension and hypothyroidism atrial fibrillation  Substance abuse history: None  Past Psychiatric History: No past psychiatric history other than identified dementia. No history of psychiatric treatment depression suicide psychiatric hospitalization. As noted above family reports that before this incident with fall and hospitalization the patient was verbal and interactive rate able to recognize family and appeared to have some quality of life although she clearly had cognitive impairment.  Risk to Self: Is patient at risk for suicide?: No Risk to Others:   Prior Inpatient Therapy:   Prior Outpatient Therapy:    Past Medical History:  Past Medical History:  Diagnosis Date  . A-fib ([place])   . Atrial fibrillation ([place]) 2013   per family  .  Dementia   . Hypertension   . Thyroid disease   . TIA (transient ischemic  attack)     Past Surgical History:  Procedure Laterality Date  . CHOLECYSTECTOMY    . HIP ARTHROPLASTY Right 02/25/2017   Procedure: ARTHROPLASTY BIPOLAR HIP (HEMIARTHROPLASTY);  Surgeon: Thornton Park, MD;  Location: [facility] ORS;  Service: Orthopedics;  Laterality: Right;  . PERIPHERAL VASCULAR CATHETERIZATION Right 06/09/2015   Procedure: Lower Extremity Angiography;  Surgeon: Katha Cabal, MD;  Location: [place] [facility];  Service: Cardiovascular;  Laterality: Right;  . PERIPHERAL VASCULAR CATHETERIZATION Right 06/09/2015   Procedure: Lower Extremity Intervention;  Surgeon: Katha Cabal, MD;  Location: [place] [facility];  Service: Cardiovascular;  Laterality: Right;   Family History:  Family History  Problem Relation Age of Onset  . Hypertension Mother    Family Psychiatric  History: None identified Social History:  History  Alcohol Use No    Comment: nightly     History  Drug Use No    Social History   Social History  . Marital status: Unknown    Spouse name: N/A  . Number of children: N/A  . Years of education: N/A   Social History Main Topics  . Smoking status: Never Smoker  . Smokeless tobacco: Never Used  . Alcohol use No     Comment: nightly  . Drug use: No  . Sexual activity: Not Currently   Other Topics Concern  . None   Social History Narrative  . None   Additional Social History:    Allergies:   Allergies  Allergen Reactions  . Sulfamethoxazole-Trimethoprim Rash  . Cephalexin Itching    Labs:  Results for orders placed or performed during the hospital encounter of 02/25/17 (from the past 48 hour(s))  CBC     Status: Abnormal   Collection Time: 02/27/17  6:17 AM  Result Value Ref Range   WBC 14.4 (H) 3.6 - 11.0 K/uL   RBC 2.71 (L) 3.80 - 5.20 MIL/uL   Hemoglobin 10.8 (L) 12.0 - 16.0 g/dL   HCT 31.2 (L) 35.0 - 47.0 %   MCV 115.4 (H) 80.0 - 100.0 fL   MCH 39.7 (H) 26.0 - 34.0 pg   MCHC 34.5 32.0 - 36.0 g/dL   RDW 14.0 11.5  - 14.5 %   Platelets 139 (L) 150 - 440 K/uL  Basic metabolic panel     Status: Abnormal   Collection Time: 02/27/17  6:17 AM  Result Value Ref Range   Sodium 137 135 - 145 mmol/L   Potassium 3.8 3.5 - 5.1 mmol/L   Chloride 109 101 - 111 mmol/L   CO2 22 22 - 32 mmol/L   Glucose, Bld 133 (H) 65 - 99 mg/dL   BUN 9 6 - 20 mg/dL   Creatinine, Ser 0.65 0.44 - 1.00 mg/dL   Calcium 8.2 (L) 8.9 - 10.3 mg/dL   GFR calc non Af Amer >60 >60 mL/min   GFR calc Af Amer >60 >60 mL/min    Comment: (NOTE) The eGFR has been calculated using the CKD EPI equation. This calculation has not been validated in all clinical situations. eGFR's persistently <60 mL/min signify possible Chronic Kidney Disease.    Anion gap 6 5 - 15  CBC     Status: Abnormal   Collection Time: 02/28/17  5:50 AM  Result Value Ref Range   WBC 10.9 3.6 - 11.0 K/uL   RBC 2.34 (L) 3.80 - 5.20 MIL/uL   Hemoglobin  9.3 (L) 12.0 - 16.0 g/dL   HCT 26.4 (L) 35.0 - 47.0 %   MCV 113.2 (H) 80.0 - 100.0 fL   MCH 39.7 (H) 26.0 - 34.0 pg   MCHC 35.1 32.0 - 36.0 g/dL   RDW 14.1 11.5 - 14.5 %   Platelets 132 (L) 150 - 440 K/uL    Current Facility-Administered Medications  Medication Dose Route Frequency Provider Last Rate Last Dose  . 0.9 %  sodium chloride infusion  75 mL/hr Intravenous Continuous Thornton Park, MD 75 mL/hr at 02/26/17 2339 75 mL/hr at 02/26/17 2339  . acetaminophen (TYLENOL) tablet 500 mg  500 mg Oral Q6H PRN Gouru, Aruna, MD   500 mg at 02/28/17 0244  . alum & mag hydroxide-simeth (MAALOX/MYLANTA) 200-200-20 MG/5ML suspension 30 mL  30 mL Oral Q4H PRN Thornton Park, MD      . aspirin EC tablet 81 mg  81 mg Oral Daily Gouru, Aruna, MD      . atorvastatin (LIPITOR) tablet 40 mg  40 mg Oral q1800 Gouru, Aruna, MD   40 mg at 02/26/17 1846  . bisacodyl (DULCOLAX) suppository 10 mg  10 mg Rectal Daily PRN Thornton Park, MD      . enoxaparin (LOVENOX) injection 40 mg  40 mg Subcutaneous Q24H Thornton Park, MD   40  mg at 02/28/17 1137  . guaifenesin (ROBITUSSIN) 100 MG/5ML syrup 100 mg  100 mg Oral Q6H PRN Gouru, Aruna, MD      . HYDROcodone-acetaminophen (NORCO/VICODIN) 5-325 MG per tablet 1-2 tablet  1-2 tablet Oral Q6H PRN Thornton Park, MD   2 tablet at 02/26/17 1422  . levothyroxine (SYNTHROID, LEVOTHROID) tablet 50 mcg  50 mcg Oral Prudence Davidson, MD   Stopped at 02/28/17 0700  . magnesium citrate solution 1 Bottle  1 Bottle Oral Once PRN Thornton Park, MD      . menthol-cetylpyridinium (CEPACOL) lozenge 3 mg  1 lozenge Oral PRN Thornton Park, MD       Or  . phenol (CHLORASEPTIC) mouth spray 1 spray  1 spray Mouth/Throat PRN Thornton Park, MD      . methocarbamol (ROBAXIN) tablet 500 mg  500 mg Oral Q6H PRN Thornton Park, MD       Or  . methocarbamol (ROBAXIN) 500 mg in dextrose 5 % 50 mL IVPB  500 mg Intravenous Q6H PRN Thornton Park, MD      . metoprolol tartrate (LOPRESSOR) tablet 25 mg  25 mg Oral BID Gouru, Aruna, MD   25 mg at 02/27/17 2200  . morphine 2 MG/ML injection 2 mg  2 mg Intravenous Q2H PRN Thornton Park, MD      . ondansetron [facility]) tablet 4 mg  4 mg Oral Q6H PRN Thornton Park, MD       Or  . ondansetron [facility]) injection 4 mg  4 mg Intravenous Q6H PRN Thornton Park, MD      . polyethylene glycol (MIRALAX / GLYCOLAX) packet 17 g  17 g Oral Daily PRN Thornton Park, MD      . polyvinyl alcohol (LIQUIFILM TEARS) 1.4 % ophthalmic solution 1 drop  1 drop Right Eye TID Gouru, Aruna, MD   1 drop at 02/27/17 2200  . potassium chloride (K-DUR,KLOR-CON) CR tablet 10 mEq  10 mEq Oral Daily Gouru, Aruna, MD   10 mEq at 02/27/17 1456  . risperiDONE (RISPERDAL) tablet 1 mg  1 mg Oral QHS Gouru, Aruna, MD   1 mg at 02/27/17 2057  . senna (  SENOKOT) tablet 8.6 mg  1 tablet Oral BID Thornton Park, MD   8.6 mg at 02/27/17 1458  . senna-docusate (Senokot-S) tablet 1 tablet  1 tablet Oral QHS PRN Nicholes Mango, MD        Musculoskeletal: Strength & Muscle Tone:  decreased Gait & Station: unable to stand Patient leans: N/A  Psychiatric Specialty Exam: Physical Exam  Nursing note and vitals reviewed. Constitutional: No distress.  HENT:  Head: Normocephalic and atraumatic.  Eyes: Pupils are equal, round, and reactive to light. Conjunctivae are normal.  Neck: Normal range of motion.  Cardiovascular: Normal rate.   Respiratory: Effort normal. No respiratory distress.  GI: She exhibits no distension.  Musculoskeletal:       Arms: Neurological: She is alert.  Skin: Skin is warm and dry.  Psychiatric: Her affect is blunt. She is not agitated. Thought content is not paranoid. Cognition and memory are impaired. She expresses no homicidal and no suicidal ideation. She is noncommunicative.    Review of Systems  Unable to perform ROS: Patient unresponsive    Blood pressure 105/65, pulse 90, temperature 98.1 F (36.7 C), temperature source Axillary, resp. rate 14, height _0  (1.6 m), weight 60.1 kg (132 lb 9.6 oz), SpO2 98 %.Body mass index is 23.49 kg/m.  General Appearance: Fairly Groomed  Eye Contact:  None  Speech:  Negative  Volume:  Decreased  Mood:  Negative  Affect:  Negative  Thought Process:  NA  Orientation:  Negative  Thought Content:  Negative  Suicidal Thoughts:  Not applicable  Homicidal Thoughts:  Not applicable  Memory:  Negative  Judgement:  Negative  Insight:  Negative  Psychomotor Activity:  Negative  Concentration:  Concentration: Negative  Recall:  Negative  Fund of Knowledge:  Negative  Language:  Negative  Akathisia:  Negative  Handed:  Right  AIMS (if indicated):     Assets:  Social Support  ADL's:  Impaired  Cognition:  Impaired,  Severe  Sleep:        Treatment Plan Summary: Medication management and Plan 83 year old woman who currently presents as unresponsive with presumed progression of delirious state as described yesterday. As far as capacity it is obvious that the patient has no capacity  whatsoever currently to make any decisions. She is not even able to interact. Only indication for any psychiatric treatment, as I explained to the family, would be the availability of very low doses of antipsychotics to be used only if the patient is agitated or hallucinating to a degree that is either dangerous to her or clearly causing her distress. I will make sure there is an order in place for that. Otherwise no need for any mental health follow-up. If for some reason her mental status changes and needs to be reassessed please contact me or psychiatrist on call.  Disposition: Patient does not meet criteria for psychiatric inpatient admission. Supportive therapy provided about ongoing stressors.  Alethia Berthold, MD 02/28/2017 12:12 PM

## 2017-02-28 NOTE — Progress Notes (Signed)
[facility] - [place] at [facility]   PATIENT NAME: Casey Sanford    MR#:  [id number]  DATE OF BIRTH:  1923-04-16  SUBJECTIVE:  CHIEF COMPLAINT:  Patient is sleepy today. Decreased by mouth intake. Daughters at bedside.  REVIEW OF SYSTEMS:  CONSTITUTIONAL: No fever, fatigue or weakness. Poor by mouth intake according to the daughters EYES: No blurred or double vision.  EARS, NOSE, AND THROAT: No tinnitus or ear pain. Reporting neck pain RESPIRATORY: No cough, shortness of breath, wheezing or hemoptysis.  CARDIOVASCULAR: No chest pain, orthopnea, edema.  GASTROINTESTINAL: No nausea, vomiting, diarrhea or abdominal pain.  GENITOURINARY: No dysuria, hematuria.  ENDOCRINE: No polyuria, nocturia,  HEMATOLOGY: No anemia, easy bruising or bleeding SKIN: No rash or lesion. MUSCULOSKELETAL: right hip pain.   NEUROLOGIC: No tingling, numbness, weakness.  PSYCHIATRY: No anxiety or depression.   DRUG ALLERGIES:   Allergies  Allergen Reactions  . Sulfamethoxazole-Trimethoprim Rash  . Cephalexin Itching    VITALS:  Blood pressure 105/65, pulse 90, temperature 98.1 F (36.7 C), temperature source Axillary, resp. rate 14, height  (1.6 m), weight 60.1 kg (132 lb 9.6 oz), SpO2 98 %.  PHYSICAL EXAMINATION:  GENERAL:  83 y.o.-year-old patient lying in the bed with no acute distress.  EYES: Pupils equal, round, reactive to light and accommodation. No scleral icterus. Extraocular muscles intact.  HEENT: Head atraumatic, normocephalic. Oropharynx and nasopharynx clear.  NECK:  Back of the neck is tender range of motion decreased , no jugular venous distention. No thyroid enlargement, no tenderness.  LUNGS: Normal breath sounds bilaterally, no wheezing, rales,rhonchi or crepitation. No use of accessory muscles of respiration.  CARDIOVASCULAR: S1, S2 normal. No murmurs, rubs, or gallops.  ABDOMEN: Soft, nontender, nondistended. Bowel sounds present. No organomegaly or  mass.  EXTREMITIES: No pedal edema, cyanosis, or clubbing.  NEUROLOGIC: sleepy, arousable, but easily goes back to sleep. PSYCHIATRIC: The patient is drowsy  SKIN: No obvious rash, lesion, or ulcer.    LABORATORY PANEL:   CBC  Recent Labs Lab 02/28/17 0550  WBC 10.9  HGB 9.3*  HCT 26.4*  PLT 132*   ------------------------------------------------------------------------------------------------------------------  Chemistries   Recent Labs Lab 02/27/17 0617  NA 137  K 3.8  CL 109  CO2 22  GLUCOSE 133*  BUN 9  CREATININE 0.65  CALCIUM 8.2*   ------------------------------------------------------------------------------------------------------------------  Cardiac Enzymes  Recent Labs Lab 02/25/17 0645  TROPONINI <0.03   ------------------------------------------------------------------------------------------------------------------  RADIOLOGY:  Dg Cervical Spine 2 Or 3 Views  Result Date: 02/27/2017 CLINICAL DATA:  Patient reports diffuse neck pain onset today. No known injury to neck. No previous injury. No previous surgery. EXAM: CERVICAL SPINE - 2-3 VIEW COMPARISON:  CT of the head 07/21/2016 FINDINGS: Bones appear osteopenic. There is normal alignment C1 through T1. No acute fracture or subluxation. Prevertebral soft tissues have a normal appearance. Lung apices are clear. There is atherosclerotic calcification of the carotid bulb regions. IMPRESSION: 1. Normal alignment.  No evidence for acute abnormality. 2. Carotid bulb atherosclerosis. Electronically Signed   By: Norva Pavlov M.D.   On: 02/27/2017 13:33    EKG:   Orders placed or performed during the hospital encounter of 02/25/17  . ED EKG  . ED EKG  . EKG 12-Lead  . EKG 12-Lead    ASSESSMENT AND PLAN:     Casey Sanford  is a 83 y.o. female with a known history of Chronic atrial fibrillation, dementia, hypertension, hypothyroidism and other medical problem is [facility] ED  [facility] after she sustained a witnessed fall. Patient was in severe right hip pain and x-ray has revealed right femoral neck fracture.   #Acute neck pain Cervical collar, cervical x-ray- negative for fractures. Pain management as needed  # Right hip pain-acute secondary to right femoral neck fracture Status post right hip arthroplasty on 02/25/2017 postop day #3 Pain management as needed Patient is wheelchair bound at her baseline  Now does not want to participate with PT  #Chronic atrial fibrillation rate controlled On metoprolol Primary care physician has discontinued Eliquis as patient is at high risk for falls resume aspirin   She is refusing for meds.  #Hypothyroidism continue Synthroid  #Essential hypertension continue metoprolol Losartan discontinued by primary care physician   #Chronic history of dementia with sundowning Monitor patient closely, neuro checks   #Hyperlipidemia continue Lipitor  Pt is refusing for PT, Food and medications, she was on hospice care in past, so spoke to 2 daughters at bedside in details, they agreed to meet palliative care today to decide plans.  All the records are reviewed and case discussed with Care Management/Social Workerr. Management plans discussed with the patient, family and they are in agreement.  CODE STATUS: dnr  TOTAL TIME TAKING CARE OF THIS PATIENT: 45  minutes.   POSSIBLE D/C IN 2  DAYS, DEPENDING ON CLINICAL CONDITION.  Note: This dictation was prepared with Dragon dictation along with smaller phrase technology. Any transcriptional errors that result from this process are unintentional.   Altamese DillingVACHHANI, Laiyla Slagel M.D on 02/28/2017 at 10:45 AM  Between 7am to 6pm - Pager - [phone] After 6pm go to [email or web address] - password [email or web address] [facility]  [facility]  Office  [phone]  CC: Primary care physician; Jaclyn Shaggyate, Denny C, MD

## 2017-03-01 DIAGNOSIS — F0390 Unspecified dementia without behavioral disturbance: Secondary | ICD-10-CM

## 2017-03-01 DIAGNOSIS — S72001A Fracture of unspecified part of neck of right femur, initial encounter for closed fracture: Secondary | ICD-10-CM

## 2017-03-01 MED ORDER — POLYVINYL ALCOHOL 1.4 % OP SOLN
1.0000 [drp] | Freq: Four times a day (QID) | OPHTHALMIC | Status: DC | PRN
  Filled 2017-03-01: qty 15

## 2017-03-01 MED ORDER — MORPHINE SULFATE (CONCENTRATE) 10 MG/0.5ML PO SOLN: 5.0000 mg | Freq: Three times a day (TID) | ORAL | 0 refills | Status: AC

## 2017-03-01 MED ORDER — BIOTENE DRY MOUTH MT LIQD: 15.0000 mL | OROMUCOSAL | Status: DC | PRN

## 2017-03-01 MED ORDER — MORPHINE SULFATE (CONCENTRATE) 10 MG/0.5ML PO SOLN: 5.0000 mg | Freq: Three times a day (TID) | ORAL | Status: DC

## 2017-03-01 MED ORDER — SODIUM CHLORIDE 0.9% FLUSH: 3.0000 mL | INTRAVENOUS | Status: DC | PRN

## 2017-03-01 MED ORDER — LORAZEPAM 1 MG PO TABS: 1.0000 mg | ORAL_TABLET | ORAL | Status: DC | PRN

## 2017-03-01 MED ORDER — LORAZEPAM 2 MG/ML PO CONC: 1.0000 mg | ORAL | Status: DC | PRN

## 2017-03-01 MED ORDER — MORPHINE SULFATE (CONCENTRATE) 10 MG/0.5ML PO SOLN: 5.0000 mg | ORAL | Status: DC | PRN

## 2017-03-01 MED ORDER — SODIUM CHLORIDE 0.9 % IV SOLN: 250.0000 mL | INTRAVENOUS | Status: DC | PRN

## 2017-03-01 MED ORDER — GLYCOPYRROLATE 1 MG PO TABS
1.0000 mg | ORAL_TABLET | ORAL | Status: DC | PRN
  Filled 2017-03-01: qty 1

## 2017-03-01 MED ORDER — MORPHINE SULFATE (PF) 2 MG/ML IV SOLN: 1.0000 mg | INTRAVENOUS | Status: DC | PRN

## 2017-03-01 MED ORDER — SODIUM CHLORIDE 0.9% FLUSH: 3.0000 mL | Freq: Two times a day (BID) | INTRAVENOUS | Status: DC

## 2017-03-01 MED ORDER — LORAZEPAM 2 MG/ML PO CONC: 1.0000 mg | ORAL | 0 refills | Status: AC | PRN

## 2017-03-01 MED ORDER — MORPHINE SULFATE (CONCENTRATE) 10 MG/0.5ML PO SOLN
5.0000 mg | ORAL | Status: DC | PRN
Start: 2017-03-01 — End: 2017-03-01

## 2017-03-01 MED ORDER — HALOPERIDOL LACTATE 2 MG/ML PO CONC
0.5000 mg | ORAL | Status: DC | PRN
  Filled 2017-03-01: qty 0.3

## 2017-03-01 MED ORDER — MORPHINE SULFATE (CONCENTRATE) 10 MG/0.5ML PO SOLN: 5.0000 mg | ORAL | 0 refills | Status: AC | PRN

## 2017-03-01 MED ORDER — GLYCOPYRROLATE 0.2 MG/ML IJ SOLN
0.2000 mg | INTRAMUSCULAR | Status: DC | PRN
  Filled 2017-03-01: qty 1

## 2017-03-01 MED ORDER — HALOPERIDOL 0.5 MG PO TABS: 0.5000 mg | ORAL_TABLET | ORAL | 0 refills | Status: AC | PRN

## 2017-03-01 MED ORDER — HALOPERIDOL 0.5 MG PO TABS
0.5000 mg | ORAL_TABLET | ORAL | Status: DC | PRN
  Filled 2017-03-01: qty 1

## 2017-03-01 MED ORDER — GLYCOPYRROLATE 1 MG PO TABS: 1.0000 mg | ORAL_TABLET | ORAL | 0 refills | Status: AC | PRN

## 2017-03-01 MED ORDER — LORAZEPAM 2 MG/ML IJ SOLN: 1.0000 mg | INTRAMUSCULAR | Status: DC | PRN

## 2017-03-01 MED ORDER — HALOPERIDOL LACTATE 5 MG/ML IJ SOLN: 0.5000 mg | INTRAMUSCULAR | Status: DC | PRN

## 2017-03-01 NOTE — Progress Notes (Signed)
Clinical Social Worker (CSW) received consult from palliative care team for hospice facility placement. CSW contacted patient's daughter Casey Sanford and provided hospice choice. Daughter chose [facility] in [place]. CSW contacted Casey Sanford/ [facility] hospice liaison and made her aware of above. Per Casey BraunKaren patient can come to the hospice home today. CSW prepared EMS packet including DNR. RN and MD aware of above. Please reconsult if future social work needs arise. CSW signing off.   Casey [employer]Bailey Adeyemi Hamad, LCSW [phone]

## 2017-03-01 NOTE — Progress Notes (Signed)
New hospice home referral received from [facility] following a Palliative Medicine consult. Patient is a 83 year old woman admitted from [facility] following a fall in which she suffered a broken right hip. Past medical history includes: Afib, dementia, HTN and thyroid disease. She is post op day 4, with poor appetite and lethargy. Palliative medicine was consulted for goals of care and met with her daughters who have chosen to focus on her comfort with transfer to the hospice home.  Write met in the room with patient's daughter's Kathe Becton and Arbie Cookey to initiate education regarding hospice services, philosophy and team approach to care with good understanding voiced. Questions answered. Consents signed. Patient was alert and did interact with Probation officer at visit. She did not eat her breakfast and per report from staff and chart note review she has been lethargic for the past day and a half, not eating and not participating therapy. Of note patient was followed by hospice in the past, she was being followed by Palliative at [facility]. Patient information faxed to referral, report called to the Hospice home, EMS notified for transport. Signed portable DNR in place in discharge packet. Thank you. Flo Shanks RN, BSN, [facility] [place], hospital liaison [phone] c

## 2017-03-01 NOTE — Progress Notes (Signed)
PT Cancellation Note  Patient Details Name: Casey BakerHazel H Molinelli MRN: [id number] DOB: 07/08/1922   Cancelled Treatment:    Reason Eval/Treat Not Completed: Other (comment).  PT order cancelled by Palliative Care.  Please re-consult PT if plan of care/goals change.   Hendricks LimesEmily Keyera Hattabaugh, PT 03/01/17, 9:42 AM [phone]

## 2017-03-01 NOTE — Consult Note (Signed)
Consultation Note Date: 03/01/2017   Patient Name: Casey Sanford  DOB: 11-13-1922  MRN: [id number]  Age / Sex: 83 y.o., female  PCP: Albina Billet, MD Referring Physician: Vaughan Basta, *  Reason for Consultation: Establishing goals of care  HPI/Patient Profile: 83 y.o. female  with past medical history of dementia, afib, TIA, thyroid dz who was admitted on 02/25/2017 with right hip fracture after a witnessed fall at [facility].  She underwent surgical repair on 9/9.  Post op her family describes her as talking and fiesty, but in the days since she has become progressively more lethargic and has virtually stopped eating and drinking.   Clinical Assessment and Goals of Care:  I have reviewed medical records including EPIC notes, labs and imaging, received report from the [facility] attending physician, assessed the patient and then met at the bedside along with her 3 daughters Dorian Pod, Arbie Cookey, and Bricelyn) to discuss diagnosis prognosis, GOC, EOL wishes, disposition and options.  I introduced Palliative Medicine as specialized medical care for people living with serious illness. It focuses on providing relief from the symptoms and stress of a serious illness. The goal is to improve quality of life for both the patient and the family.  The 3 daughters tell a humorous story of the patient being "close to dead" 2x in the past and she has recovered.  The daughters tell me their mother is ready to die, and that she wants to die.  They understand that she is now completely dependent unable to even roll over in bed without assistance.  The understand she does not want to eat, drink or take her medications.  After some discussion of the options, the daughters choose for their mother to go to [facility].  They feel she will receive the best care for her current stage in life at hospice house.  We discussed the process of  making that happen and transferring her to [facility].    Due to the excellent reputation and geographic location the daughters want their mother to go to [place].   Primary Decision Maker:  NEXT OF KIN 3 dtrs Morrison Old, and Dorian Pod    SUMMARY OF RECOMMENDATIONS    Discontinue interventions and medications not specifically related to comfort.  Transfer to hospice house as soon as bed is available. - CSW order is in Epic.  Comfort medications and orders have been initiated.  D/C IVF due to increased work of breathing and change to comfort measures only.   Code Status/Advance Care Planning:  DNR   Symptom Management:   Will schedule low dose morphine for dyspnea and pain.  Psycho-social/Spiritual:   Desire for further Chaplaincy support: yes  Prognosis: less than two weeks due to lack of PO intake and a desire for comfort measures only.    Discharge Planning: Hospice facility      Primary Diagnoses: Present on Admission: . Hip fracture ([place])   I have reviewed the medical record, interviewed the patient and family, and examined the  patient. The following aspects are pertinent.  Past Medical History:  Diagnosis Date  . A-fib ([place])   . Atrial fibrillation ([place]) 2013   per family  . Dementia   . Hypertension   . Thyroid disease   . TIA (transient ischemic attack)    Social History   Social History  . Marital status: Unknown    Spouse name: N/A  . Number of children: N/A  . Years of education: N/A   Social History Main Topics  . Smoking status: Never Smoker  . Smokeless tobacco: Never Used  . Alcohol use No     Comment: nightly  . Drug use: No  . Sexual activity: Not Currently   Other Topics Concern  . None   Social History Narrative  . None   Family History  Problem Relation Age of Onset  . Hypertension Mother    Scheduled Meds: . metoprolol tartrate  25 mg Oral BID  . morphine CONCENTRATE  5 mg Sublingual Q8H    . polyvinyl alcohol  1 drop Right Eye TID  . risperiDONE  1 mg Oral QHS  . senna  1 tablet Oral BID  . sodium chloride flush  3 mL Intravenous Q12H   Continuous Infusions: . sodium chloride    . methocarbamol (ROBAXIN)  IV     PRN Meds:.sodium chloride, acetaminophen, alum & mag hydroxide-simeth, antiseptic oral rinse, bisacodyl, glycopyrrolate **OR** glycopyrrolate **OR** glycopyrrolate, guaifenesin, haloperidol **OR** haloperidol **OR** haloperidol lactate, LORazepam **OR** LORazepam **OR** LORazepam, magnesium citrate, menthol-cetylpyridinium **OR** phenol, methocarbamol **OR** methocarbamol (ROBAXIN)  IV, morphine injection, morphine CONCENTRATE **OR** morphine CONCENTRATE, ondansetron **OR** ondansetron (ZOFRAN) IV, polyethylene glycol, polyvinyl alcohol, senna-docusate, sodium chloride flush Allergies  Allergen Reactions  . Sulfamethoxazole-Trimethoprim Rash  . Cephalexin Itching   Review of Systems patient lethargic  Physical Exam  Well developed elderly female,  CV rrr resp mildly increased work of breathing. Abdomen soft, nt, nd   Vital Signs: BP (!) 158/91 (BP Location: Left Arm)   Pulse 72   Temp 97.9 F (36.6 C) (Axillary)   Resp 18   Ht '5\' 3"'  (1.6 m)   Wt 60.1 kg (132 lb 9.6 oz)   SpO2 100%   BMI 23.49 kg/m  Pain Assessment: PAINAD POSS *See Group Information*: S-Acceptable,Sleep, easy to arouse Pain Score: 0-No pain   SpO2: SpO2: 100 % O2 Device:SpO2: 100 % O2 Flow Rate: .O2 Flow Rate (L/min): 2 L/min  IO: Intake/output summary:   Intake/Output Summary (Last 24 hours) at 03/01/17 0955 Last data filed at 03/01/17 0600  Gross per 24 hour  Intake             2100 ml  Output              600 ml  Net             1500 ml    LBM: Last BM Date: 02/28/17 Baseline Weight: Weight: 60.1 kg (132 lb 9.6 oz) Most recent weight: Weight: 60.1 kg (132 lb 9.6 oz)     Palliative Assessment/Data:   Flowsheet Rows     Most Recent Value  Intake Tab  Referral  Department  Hospitalist  Unit at Time of Referral  Orthopedic Unit  Palliative Care Primary Diagnosis  Neurology  Date Notified  02/28/17  Palliative Care Type  New Palliative care  Reason for referral  Clarify Goals of Care  Date of Admission  02/25/17  # of days IP prior to Palliative referral  3  Clinical Assessment  Palliative Performance Scale Score  20%  Psychosocial & Spiritual Assessment  Palliative Care Outcomes  Patient/Family meeting held?  Yes  Who was at the meeting?  patient and 3 dtrs  Palliative Care Outcomes  Improved pain interventions, Improved non-pain symptom therapy, Clarified goals of care, Changed to focus on comfort, Transitioned to hospice      Time In: 8:50 Time Out: 9:55 Time Total: 65 min. Greater than 50%  of this time was spent counseling and coordinating care related to the above assessment and plan.  Signed by: Florentina Jenny, PA-C Palliative Medicine Pager: [phone]  Please contact Palliative Medicine Team phone at [phone] for questions and concerns.  For individual provider: See Shea Evans

## 2017-03-01 NOTE — Progress Notes (Signed)
Pt awake and talking with staff this shift. Small sips of po fluid. Was able to take some of her nigh time medications.

## 2017-03-01 NOTE — Discharge Summary (Signed)
[facility] [place] at [facility]   PATIENT NAME: Casey Sanford    MR#:  [id number]  DATE OF BIRTH:  1923/03/23  DATE OF ADMISSION:  02/25/2017 ADMITTING PHYSICIAN: Ramonita Lab, MD  DATE OF DISCHARGE: 03/01/2017   PRIMARY CARE PHYSICIAN: Jaclyn Shaggy, MD    ADMISSION DIAGNOSIS:  Closed fracture of right hip, initial encounter (HCC) [S72.001A]  DISCHARGE DIAGNOSIS:  Principal Problem:   Acute delirium Active Problems:   Hip fracture (HCC)   Moderate dementia   SECONDARY DIAGNOSIS:   Past Medical History:  Diagnosis Date  . A-fib (HCC)   . Atrial fibrillation (HCC) 2013   per family  . Dementia   . Hypertension   . Thyroid disease   . TIA (transient ischemic attack)     HOSPITAL COURSE:   Casey Sanford a 83 y.o. femalewith a known history of Chronic atrial fibrillation, dementia, hypertension, hypothyroidism and other medical problem is brought into the ED from [facility] nursing facility after she sustained a witnessed fall. Patient was in severe right hip pain and x-ray has revealed right femoral neck fracture.   #Acute neck pain Cervical collar, cervical x-ray- negative for fractures. Pain management as needed  # Right hip pain-acute secondary to right femoral neck fracture Status post right hip arthroplasty on 02/25/2017 postop day #3 Pain management as needed Patient is wheelchair bound at her baseline  Now does not want to participate with PT  #Chronic atrial fibrillation rate controlled  On metoprolol  Primary care physician has discontinued Eliquisas patient is at high risk for falls  resume aspirin  She is refusing for meds.  #Hypothyroidism continue Synthroid  #Essential hypertension continue metoprolol Losartan discontinued by primary care physician   #Chronic history of dementia with sundowning Monitor patient closely, neuro checks   #Hyperlipidemia continue Lipitor  Pt is refusing for PT, Food  and medications, she was on hospice care in past, so spoke to 2 daughters at bedside in details, they agreed to meet palliative care today to decide plans.  after meeting with palliative- family decided to move her to hospice home.  DISCHARGE CONDITIONS:   Fair.  CONSULTS OBTAINED:  Treatment Team:  Juanell Fairly, MD Clapacs, Jackquline Denmark, MD  DRUG ALLERGIES:   Allergies  Allergen Reactions  . Sulfamethoxazole-Trimethoprim Rash  . Cephalexin Itching    DISCHARGE MEDICATIONS:   Current Discharge Medication List    START taking these medications   Details  glycopyrrolate (ROBINUL) 1 MG tablet Take 1 tablet (1 mg total) by mouth every 4 (four) hours as needed (excessive secretions). Qty: 20 tablet, Refills: 0    haloperidol (HALDOL) 0.5 MG tablet Take 1 tablet (0.5 mg total) by mouth every 4 (four) hours as needed for agitation (or delirium). Qty: 10 tablet, Refills: 0    LORazepam (ATIVAN) 2 MG/ML concentrated solution Place 0.5 mLs (1 mg total) under the tongue every 4 (four) hours as needed for anxiety. Qty: 30 mL, Refills: 0    !! Morphine Sulfate (MORPHINE CONCENTRATE) 10 MG/0.5ML SOLN concentrated solution Take 0.25 mLs (5 mg total) by mouth every 2 (two) hours as needed for moderate pain (or dyspnea). Qty: 180 mL, Refills: 0    !! Morphine Sulfate (MORPHINE CONCENTRATE) 10 MG/0.5ML SOLN concentrated solution Place 0.25 mLs (5 mg total) under the tongue every 8 (eight) hours. Qty: 180 mL, Refills: 0     !! - Potential duplicate medications found. Please discuss with provider.    STOP taking these medications  acetaminophen (TYLENOL) 500 MG tablet      Emollient (AVEENO DAILY MOISTURIZING) LOTN      levothyroxine (SYNTHROID, LEVOTHROID) 50 MCG tablet      Propylene Glycol (SYSTANE BALANCE) 0.6 % SOLN      risperiDONE (RISPERDAL) 1 MG tablet      senna-docusate (SENOKOT-S) 8.6-50 MG tablet      apixaban (ELIQUIS) 5 MG TABS tablet      aspirin 81 MG chewable  tablet      atorvastatin (LIPITOR) 40 MG tablet      docusate sodium (COLACE) 100 MG capsule      furosemide (LASIX) 20 MG tablet      guaifenesin (ROBITUSSIN) 100 MG/5ML syrup      hydrocortisone cream 1 %      losartan (COZAAR) 50 MG tablet      metoprolol tartrate (LOPRESSOR) 25 MG tablet      Potassium Chloride CR (MICRO-K) 8 MEQ CPCR capsule CR          DISCHARGE INSTRUCTIONS:    Follow for comfort as needed.  If you experience worsening of your admission symptoms, develop shortness of breath, life threatening emergency, suicidal or homicidal thoughts you must seek medical attention immediately by calling 911 or calling your MD immediately  if symptoms less severe.  You Must read complete instructions/literature along with all the possible adverse reactions/side effects for all the Medicines you take and that have been prescribed to you. Take any new Medicines after you have completely understood and accept all the possible adverse reactions/side effects.   Please note  You were cared for by a hospitalist during your hospital stay. If you have any questions about your discharge medications or the care you received while you were in the hospital after you are discharged, you can call the unit and asked to speak with the hospitalist on call if the hospitalist that took care of you is not available. Once you are discharged, your primary care physician will handle any further medical issues. Please note that NO REFILLS for any discharge medications will be authorized once you are discharged, as it is imperative that you return to your primary care physician (or establish a relationship with a primary care physician if you do not have one) for your aftercare needs so that they can reassess your need for medications and monitor your lab values.    Today   CHIEF COMPLAINT:   Chief Complaint  Patient presents with  . Fall    HISTORY OF PRESENT ILLNESS:  Casey Sanford  is a  83 y.o. female with a known history of Chronic atrial fibrillation, dementia, hypertension, hypothyroidism and other medical problem is [facility] after she sustained a witnessed fall. Patient was in severe right hip pain and x-ray has revealed right femoral neck fracture. Hospitalist team is called to admit the patient. On call orthopedic surgeon Dr. Martha ClanKrasinski was notified   VITAL SIGNS:  Blood pressure (!) 158/91, pulse 72, temperature 97.9 F (36.6 C), temperature source Axillary, resp. rate 18, height 5\' 3"  (1.6 m), weight 60.1 kg (132 lb 9.6 oz), SpO2 100 %.  I/O:   Intake/Output Summary (Last 24 hours) at 03/01/17 1003 Last data filed at 03/01/17 0600  Gross per 24 hour  Intake             2100 ml  Output              600 ml  Net  1500 ml    PHYSICAL EXAMINATION:   GENERAL:  83 y.o.-year-old patient lying in the bed with no acute distress.  EYES: Pupils equal, round, reactive to light and accommodation. No scleral icterus. Extraocular muscles intact.  HEENT: Head atraumatic, normocephalic. Oropharynx and nasopharynx clear.  NECK:  Back of the neck is tender range of motion decreased , no jugular venous distention. No thyroid enlargement, no tenderness.  LUNGS: Normal breath sounds bilaterally, no wheezing, rales,rhonchi or crepitation. No use of accessory muscles of respiration.  CARDIOVASCULAR: S1, S2 normal. No murmurs, rubs, or gallops.  ABDOMEN: Soft, nontender, nondistended. Bowel sounds present. No organomegaly or mass.  EXTREMITIES: No pedal edema, cyanosis, or clubbing.  NEUROLOGIC: sleepy, arousable, but easily goes back to sleep. PSYCHIATRIC: The patient is drowsy  SKIN: No obvious rash, lesion, or ulcer.   DATA REVIEW:   CBC  Recent Labs Lab 02/28/17 0550  WBC 10.9  HGB 9.3*  HCT 26.4*  PLT 132*    Chemistries   Recent Labs Lab 02/27/17 0617  NA 137  K 3.8  CL 109  CO2 22  GLUCOSE 133*  BUN 9   CREATININE 0.65  CALCIUM 8.2*    Cardiac Enzymes  Recent Labs Lab 02/25/17 0645  TROPONINI <0.03    Microbiology Results  Results for orders placed or performed during the hospital encounter of 02/25/17  Surgical pcr screen     Status: None   Collection Time: 02/25/17 11:28 AM  Result Value Ref Range Status   MRSA, PCR NEGATIVE NEGATIVE Final   Staphylococcus aureus NEGATIVE NEGATIVE Final    Comment: (NOTE) The Xpert SA Assay (FDA approved for NASAL specimens in patients 5 years of age and older), is one component of a comprehensive surveillance program. It is not intended to diagnose infection nor to guide or monitor treatment.     RADIOLOGY:  Dg Cervical Spine 2 Or 3 Views  Result Date: 02/27/2017 CLINICAL DATA:  Patient reports diffuse neck pain onset today. No known injury to neck. No previous injury. No previous surgery. EXAM: CERVICAL SPINE - 2-3 VIEW COMPARISON:  CT of the head 07/21/2016 FINDINGS: Bones appear osteopenic. There is normal alignment C1 through T1. No acute fracture or subluxation. Prevertebral soft tissues have a normal appearance. Lung apices are clear. There is atherosclerotic calcification of the carotid bulb regions. IMPRESSION: 1. Normal alignment.  No evidence for acute abnormality. 2. Carotid bulb atherosclerosis. Electronically Signed   By: Norva Pavlov M.D.   On: 02/27/2017 13:33    EKG:   Orders placed or performed during the hospital encounter of 02/25/17  . ED EKG  . ED EKG  . EKG 12-Lead  . EKG 12-Lead      Management plans discussed with the patient, family and they are in agreement.  CODE STATUS:     Code Status Orders        Start     Ordered   03/01/17 [phone]  Do not attempt resuscitation (DNR)  Continuous    Question Answer Comment  In the event of cardiac or respiratory ARREST Do not call a "code blue"   In the event of cardiac or respiratory ARREST Do not perform Intubation, CPR, defibrillation or ACLS   In the  event of cardiac or respiratory ARREST Use medication by any route, position, wound care, and other measures to relive pain and suffering. May use oxygen, suction and manual treatment of airway obstruction as needed for comfort.      03/01/17 2130  Code Status History    Date Active Date Inactive Code Status Order ID Comments User Context   02/25/2017 12:13 PM 03/01/2017  9:38 AM DNR [id number]  Ramonita Lab, MD Inpatient   02/25/2017 11:31 AM 02/25/2017 12:13 PM Full Code [id number]  Ramonita Lab, MD Inpatient   07/21/2016  3:20 PM 07/23/2016  5:07 PM Full Code [id number]  Auburn Bilberry, MD Inpatient   06/09/2015 12:19 PM 06/09/2015  5:49 PM Full Code [id number]  Schnier, Latina Craver, MD Inpatient    Advance Directive Documentation     Most Recent Value  Type of Advance Directive  Healthcare Power of Attorney  Pre-existing out of facility DNR order (yellow form or pink MOST form)  -  "MOST" Form in Place?  -      TOTAL TIME TAKING CARE OF THIS PATIENT: 35 minutes.    Altamese Dilling M.D on 03/01/2017 at 10:03 AM  Between 7am to 6pm - Pager - [phone]  After 6pm go to [email or web address] - password [email or web address]  [facility]  Office  [phone]  CC: Primary care physician; Jaclyn Shaggy, MD   Note: This dictation was prepared with Dragon dictation along with smaller phrase technology. Any transcriptional errors that result from this process are unintentional.

## 2017-03-01 NOTE — Care Management Important Message (Signed)
Important Message  Patient Details  Name: Casey BakerHazel H Sanford MRN: [id number] Date of Birth: 23-Jun-1922   Medicare Important Message Given:  Yes    Marily MemosLisa M Othel Hoogendoorn, RN 03/01/2017, 10:07 AM

## 2017-03-01 NOTE — Progress Notes (Signed)
Patient is discharged to hospice house via EMS. Family took all belongings. Paperwork including [facility] DNR sheet. All questions answered at that time.

## 2017-03-08 DIAGNOSIS — R410 Disorientation, unspecified: Secondary | ICD-10-CM

## 2017-03-08 DIAGNOSIS — E039 Hypothyroidism, unspecified: Secondary | ICD-10-CM | POA: Diagnosis not present

## 2017-03-08 DIAGNOSIS — S72143A Displaced intertrochanteric fracture of unspecified femur, initial encounter for closed fracture: Secondary | ICD-10-CM

## 2017-03-08 DIAGNOSIS — I481 Persistent atrial fibrillation: Secondary | ICD-10-CM | POA: Diagnosis not present

## 2017-03-08 DIAGNOSIS — I10 Essential (primary) hypertension: Secondary | ICD-10-CM | POA: Diagnosis not present

## 2017-03-27 DIAGNOSIS — G301 Alzheimer's disease with late onset: Secondary | ICD-10-CM

## 2017-03-27 DIAGNOSIS — E440 Moderate protein-calorie malnutrition: Secondary | ICD-10-CM

## 2017-03-27 DIAGNOSIS — M84459A Pathological fracture, hip, unspecified, initial encounter for fracture: Secondary | ICD-10-CM

## 2017-03-27 DIAGNOSIS — I482 Chronic atrial fibrillation: Secondary | ICD-10-CM

## 2017-04-19 DEATH — deceased

## 2019-04-07 IMAGING — DX DG HIP (WITH OR WITHOUT PELVIS) 1V PORT*R*
2 series · 2 of 2 positions shown · non-contrast
Comparison: Preoperative radiographs earlier this day.

CLINICAL DATA: Post right hip arthroplasty.

EXAM:
DG HIP (WITH OR WITHOUT PELVIS) 1V PORT RIGHT

[pelvis ap]
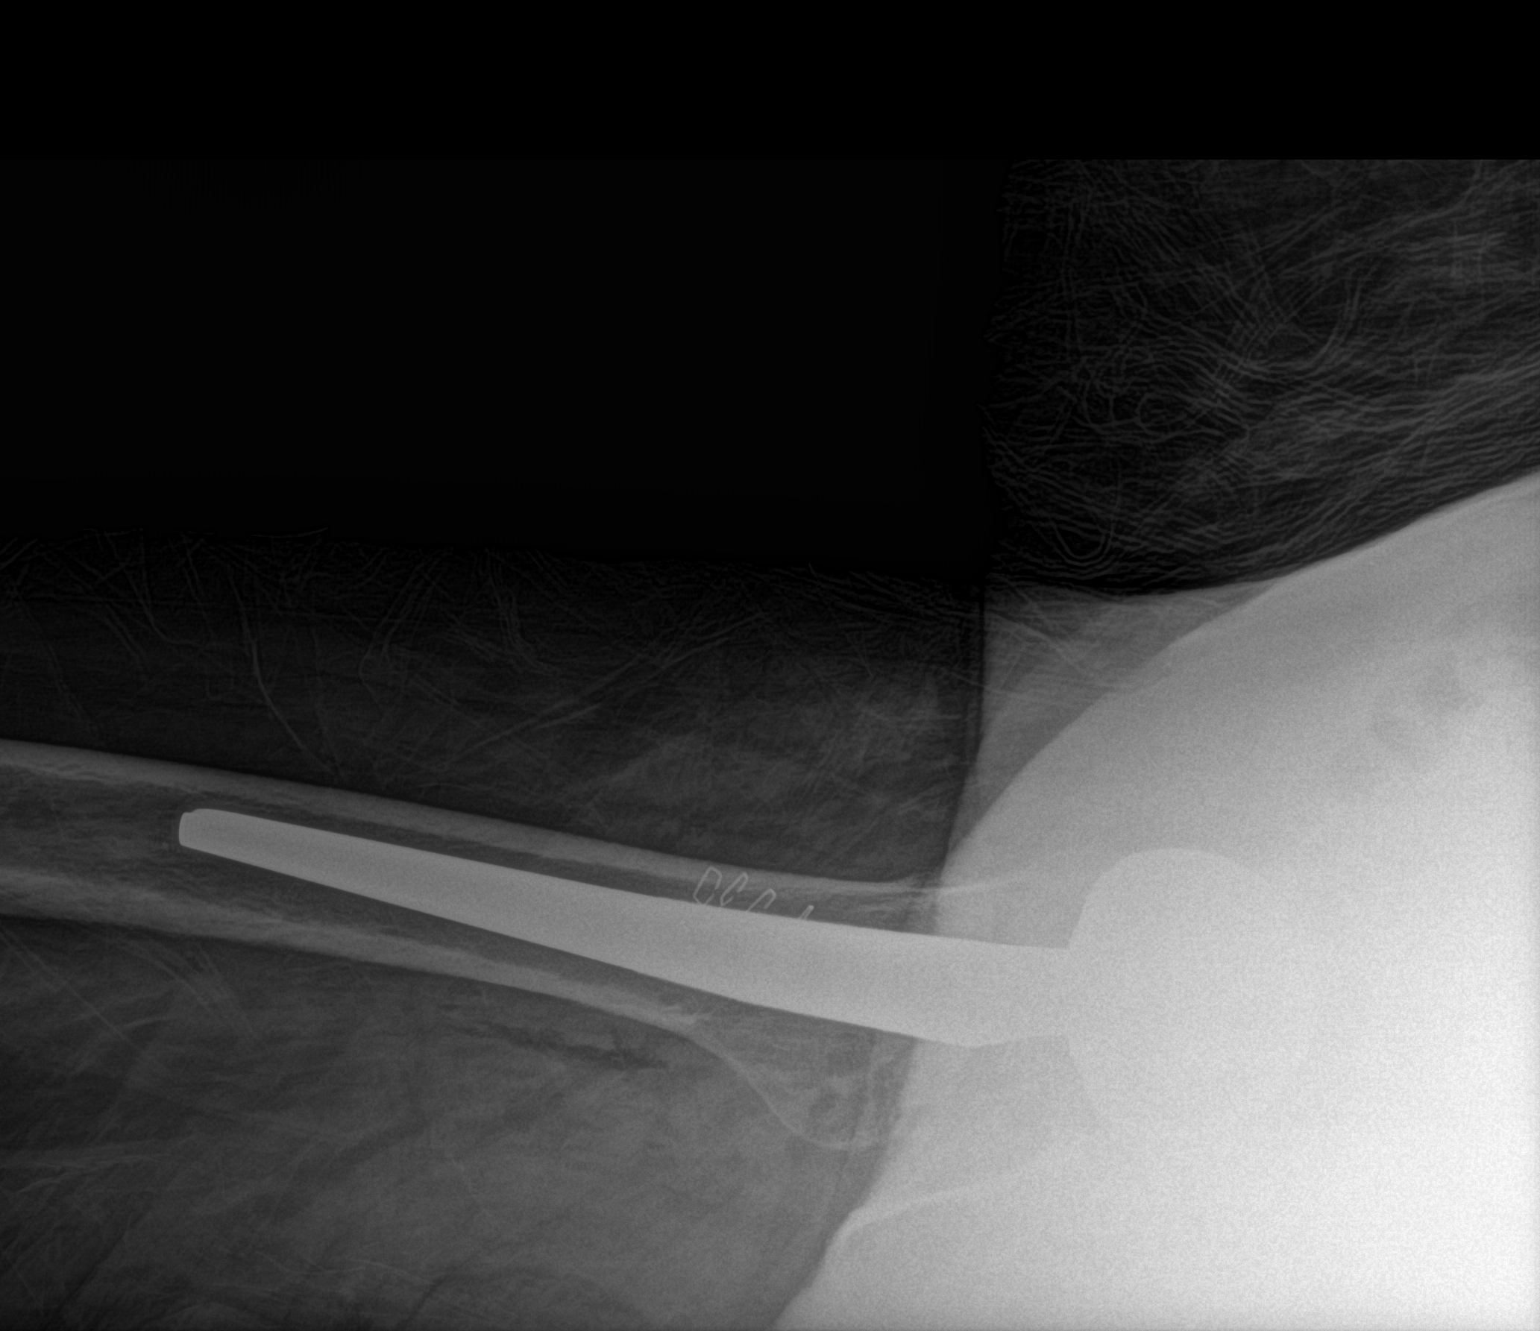

[hip lat]
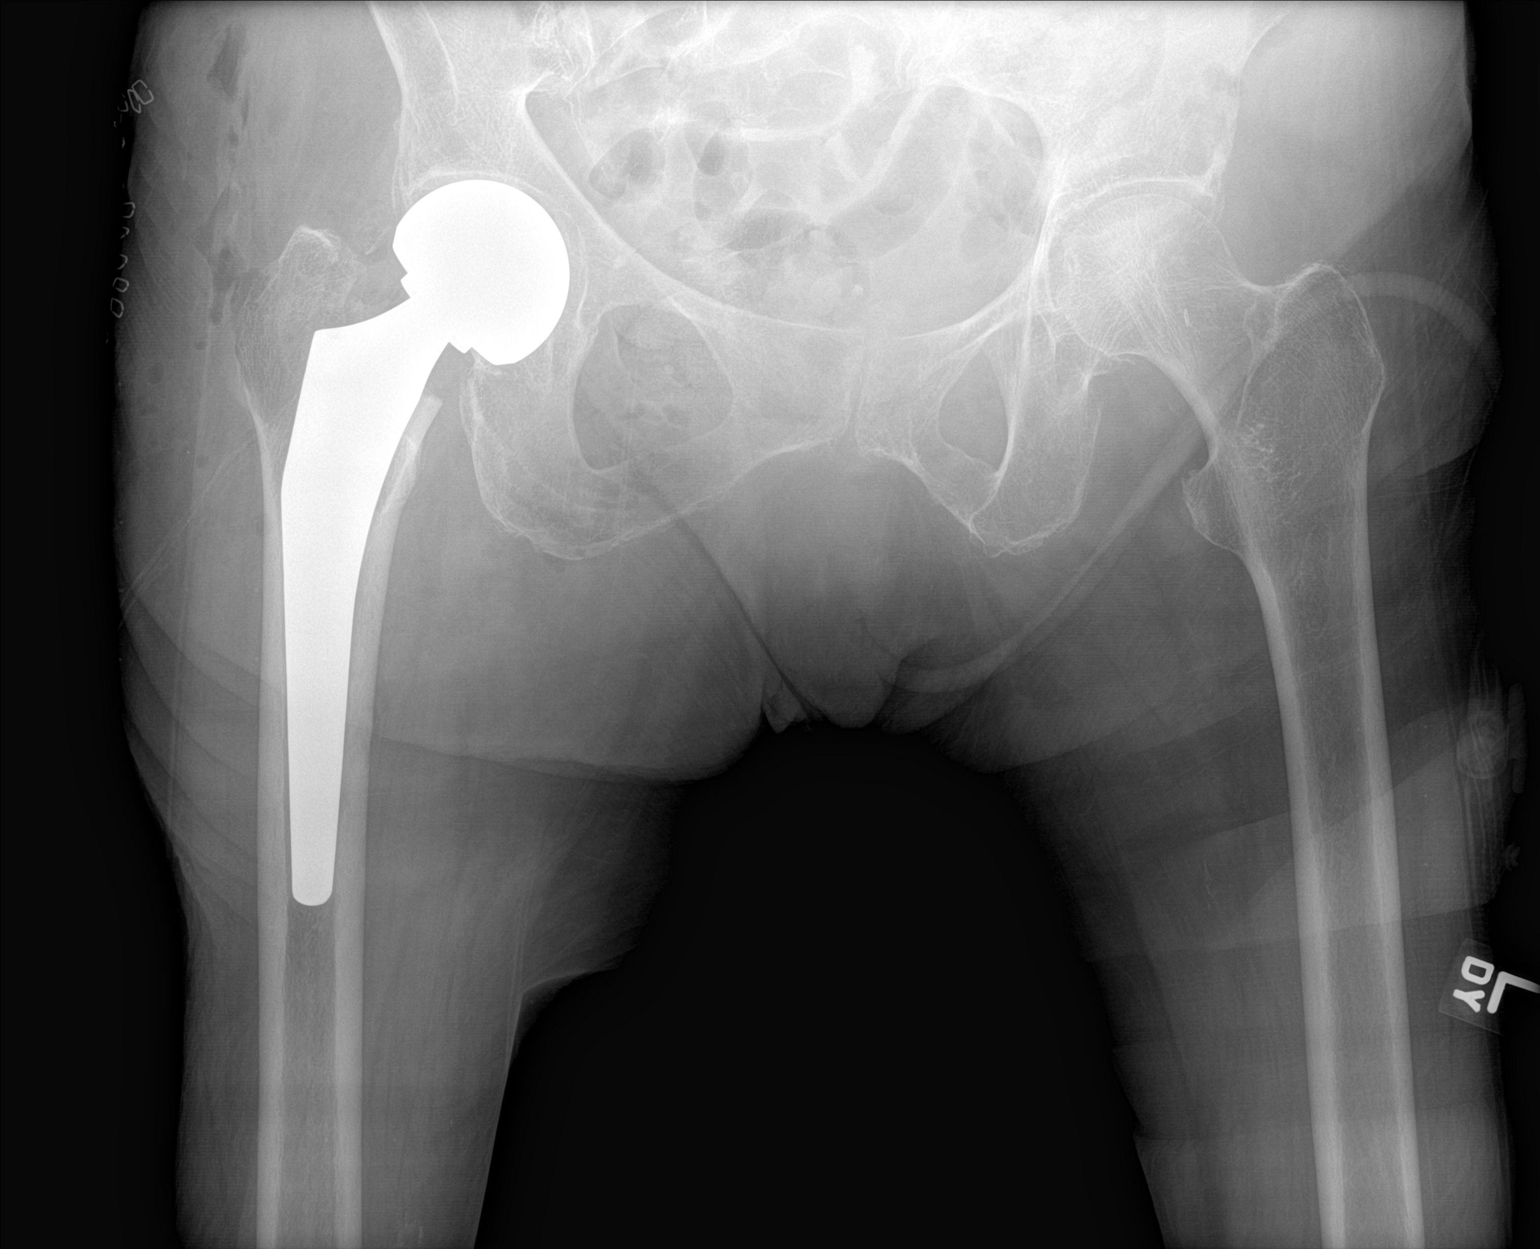

[2 of 2 positions shown; findings below may reference images not displayed]

FINDINGS: Right hip arthroplasty in expected alignment. No periprosthetic
lucency or fracture. Recent postsurgical change includes soft tissue
air and edema with lateral skin staples.
IMPRESSION: Right hip arthroplasty without immediate postoperative complication.

## 2019-04-07 IMAGING — CT CT HEAD W/O CM
4 series · 16 of 47 positions shown, 18 images · non-contrast
Comparison: Head CT dated 07/21/2016 and brain MRI dated 07/22/2016

CLINICAL DATA: Unwitnessed fall.  History of dementia.

EXAM:
CT HEAD WITHOUT CONTRAST
TECHNIQUE: Contiguous axial images were obtained from the base of the skull
through the vertex without intravenous contrast.

[Series 2: head bone · axial · 0.47mm/px · z∈[-110,-60]mm · 4 of 74 slices shown]
[im 7/74  bone]
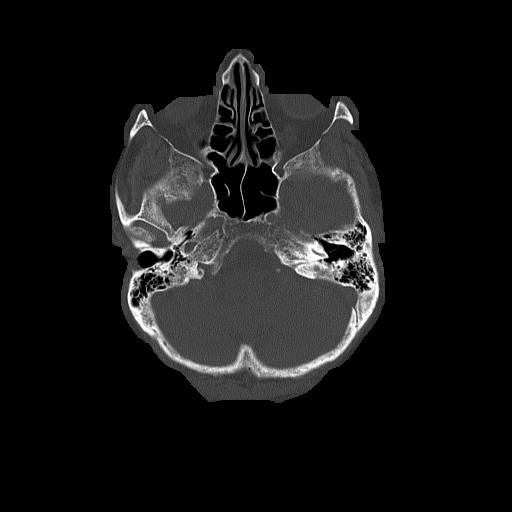
[im 14/74  bone]
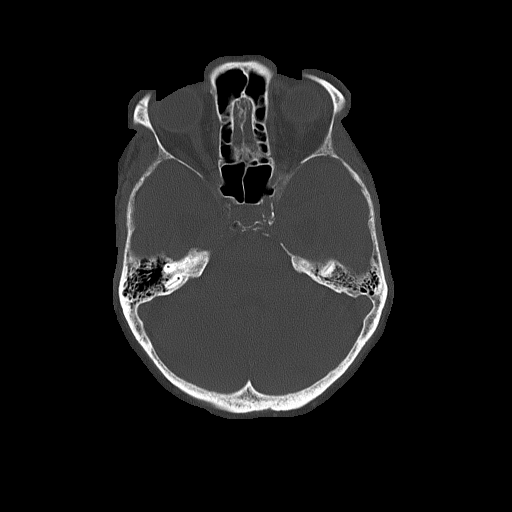
[im 25/74  bone]
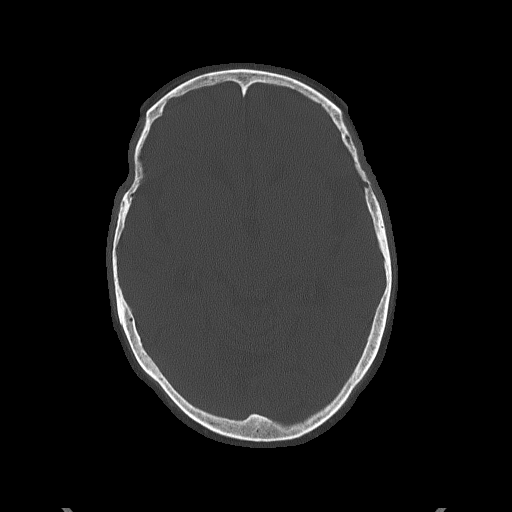
[im 32/74  bone]
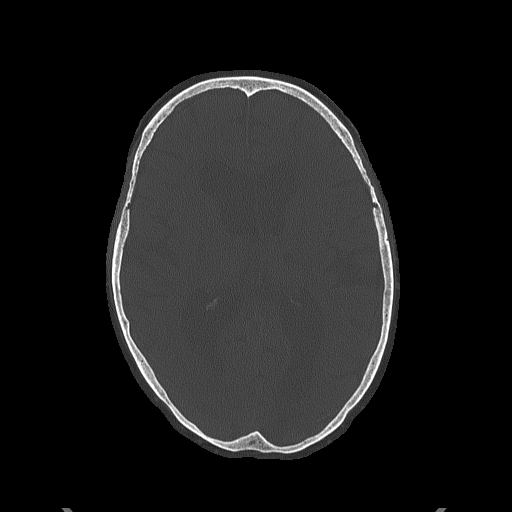

[Series 3: head wo · axial · 0.47mm/px · z∈[-106,-6]mm · 6 of 28 slices shown, 8 images]
[im 4/28  brain]
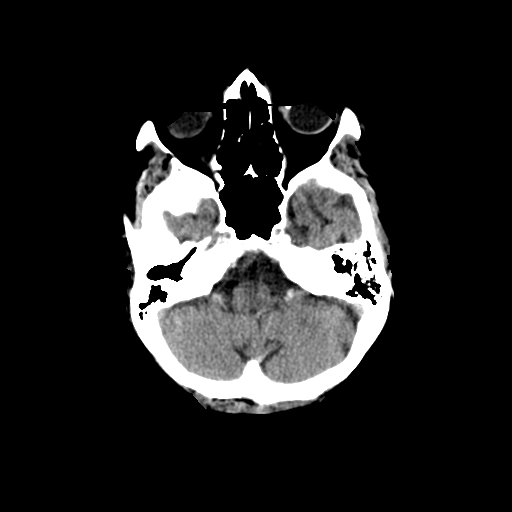
[im 4/28  bone]
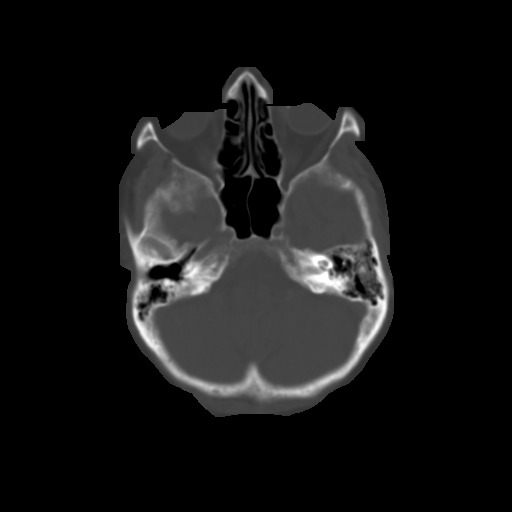
[im 8/28  brain]
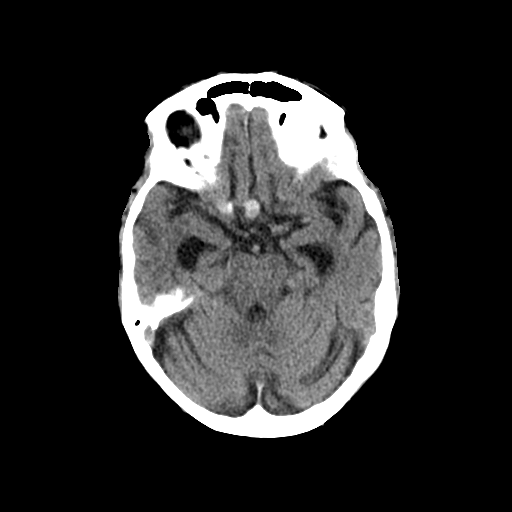
[im 12/28  brain]
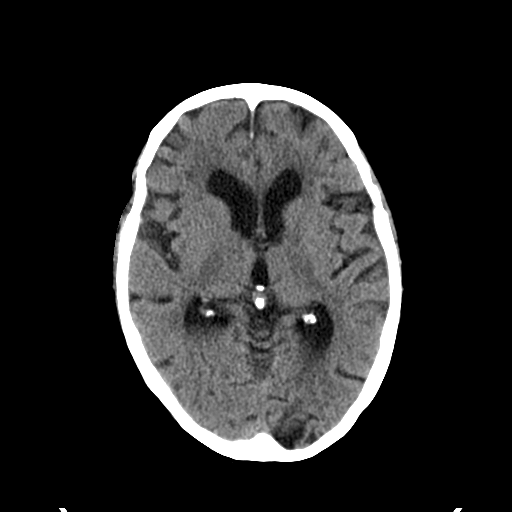
[im 16/28  brain]
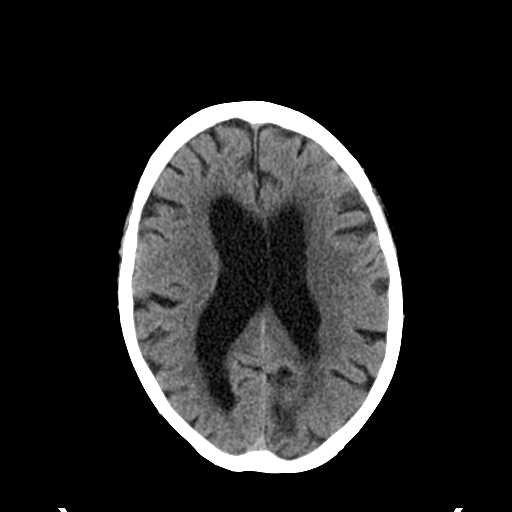
[im 20/28  brain]
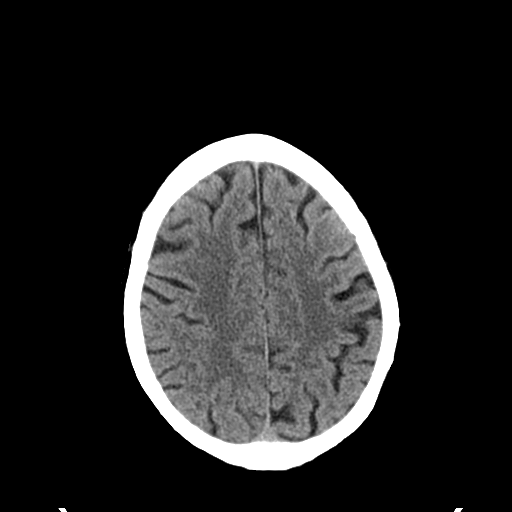
[im 20/28  bone]
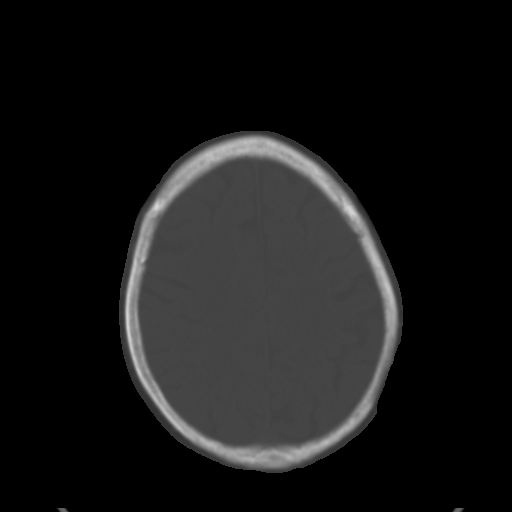
[im 24/28  brain]
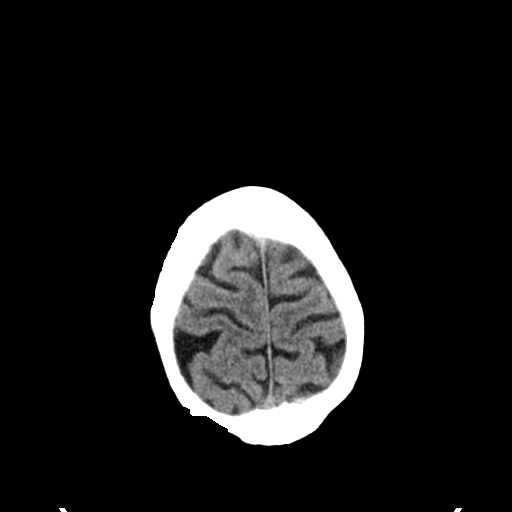

[Series 4: coronal soft tissue · coronal · 0.29mm/px · 3 of 65 slices shown]
[im 23/65  brain]
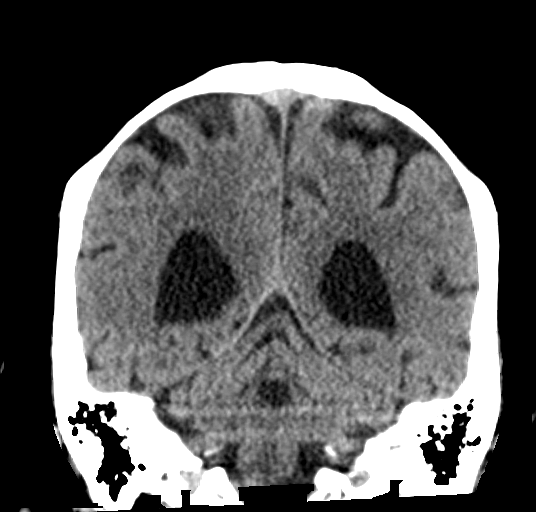
[im 29/65  brain]
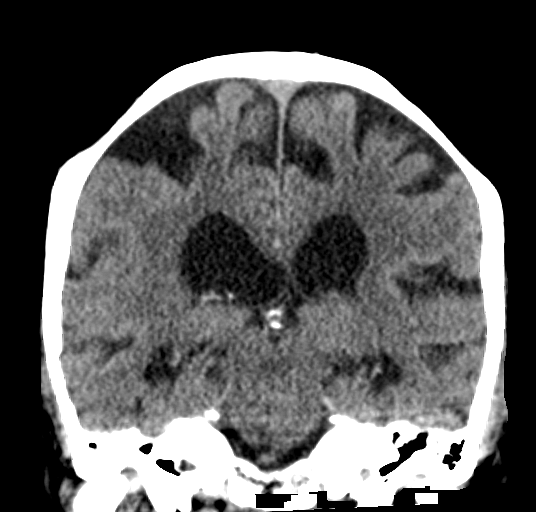
[im 36/65  brain]
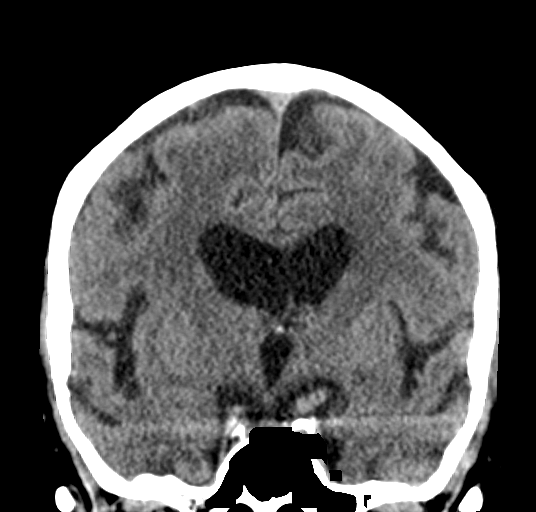

[Series 5: sagittal soft tissue · sagittal · 0.28mm/px · 3 of 53 slices shown]
[im 18/53  brain]
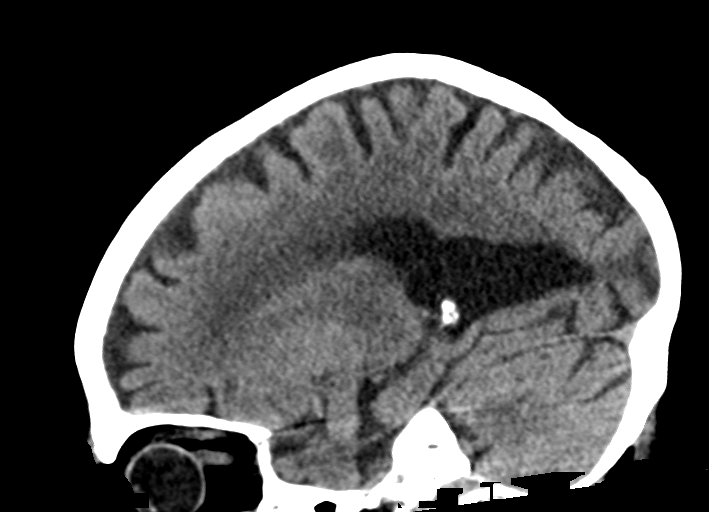
[im 27/53  brain]
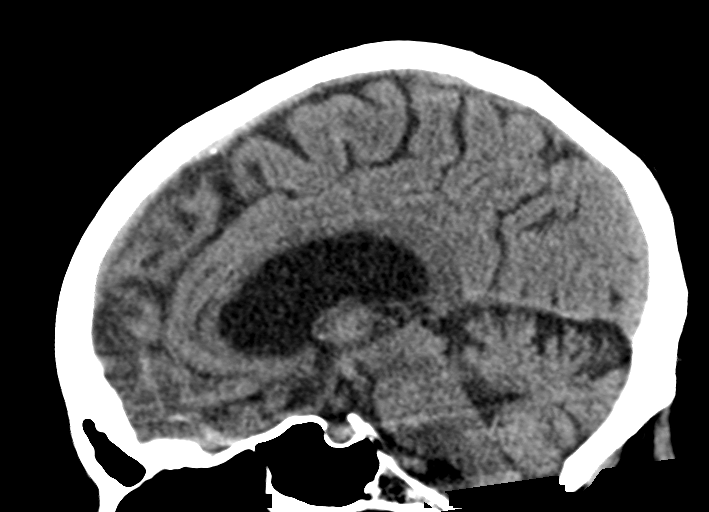
[im 35/53  brain]
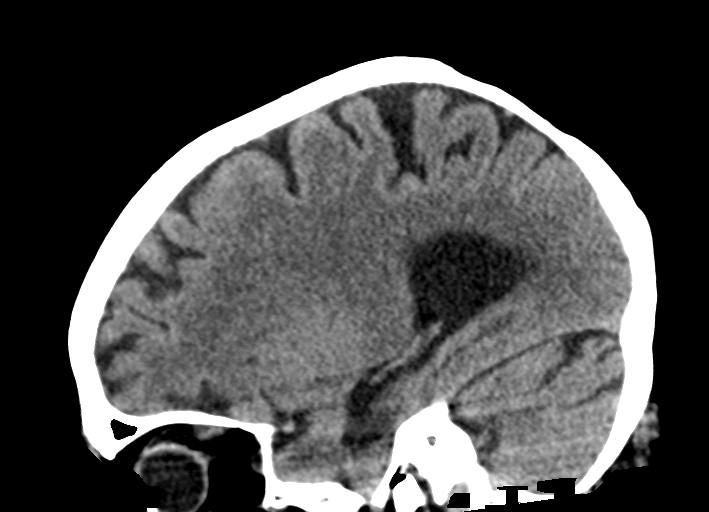

[16 of 47 positions shown; findings below may reference images not displayed]

FINDINGS: Brain: There is mild generalized age-related parenchymal atrophy
with commensurate dilatation of the ventricles and sulci. Chronic
small vessel ischemic changes again noted within the bilateral
periventricular white matter. Small old lacunar infarct noted in the
left basal ganglia. Old infarct within the left occipital lobe with
associated encephalomalacia.

There is no mass, hemorrhage, edema or other evidence of acute
parenchymal abnormality. No extra-axial hemorrhage.

Vascular: There are chronic calcified atherosclerotic changes of the
large vessels at the skull base. No unexpected hyperdense vessel.

Skull: Normal. Negative for fracture or focal lesion.

Sinuses/Orbits: No acute finding.

Other: None.
IMPRESSION: 1. No acute findings. No intracranial mass, hemorrhage or edema. No
skull fracture.
2. Chronic ischemic changes, as detailed above.

## 2019-04-09 IMAGING — CR DG CERVICAL SPINE 2 OR 3 VIEWS
1 series · 4 of 4 positions shown · non-contrast
Comparison: CT of the head 07/21/2016

CLINICAL DATA: Patient reports diffuse neck pain onset today. No
known injury to neck. No previous injury. No previous surgery.

EXAM:
CERVICAL SPINE - 2-3 VIEW

[Series 1: dg cervical spine 2 or 3 views · 0.14mm/px · 4 of 4 slices shown]
[im 1/4]
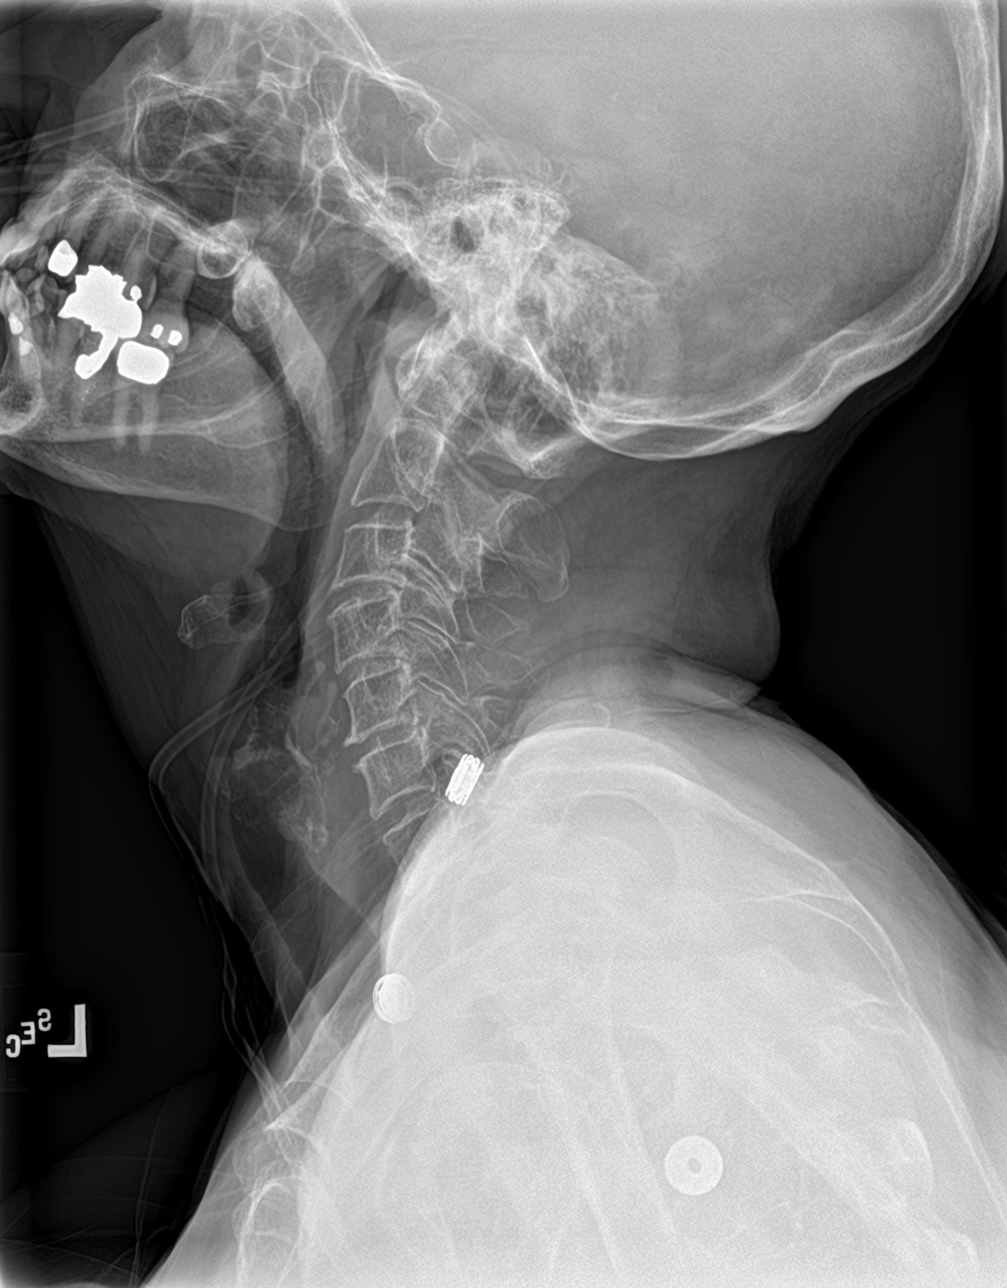
[im 2/4]
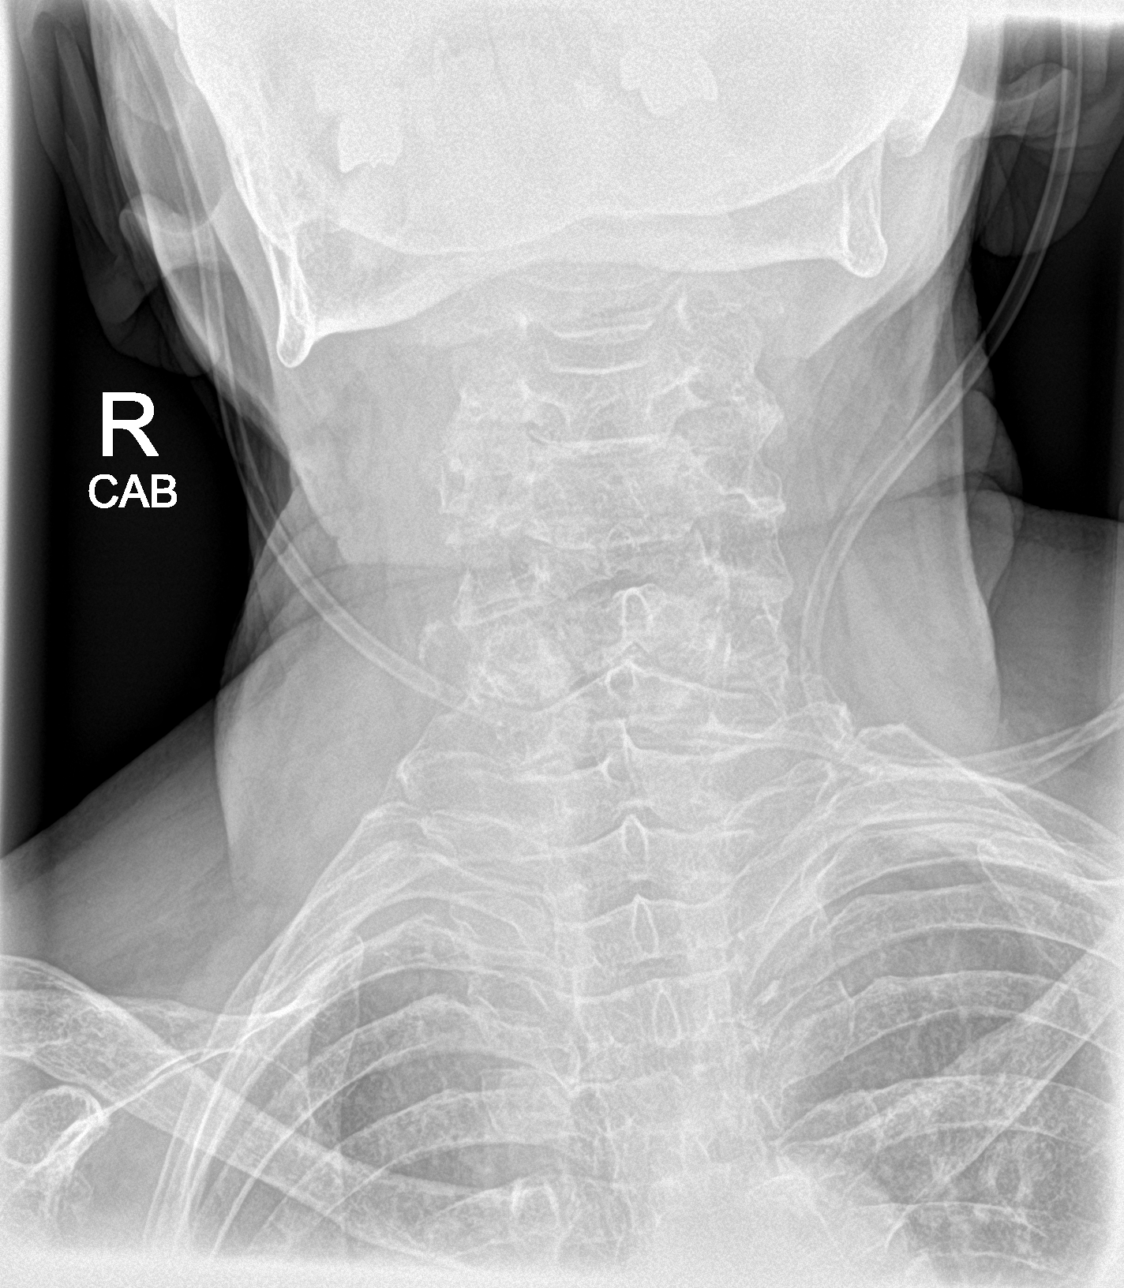
[im 3/4]
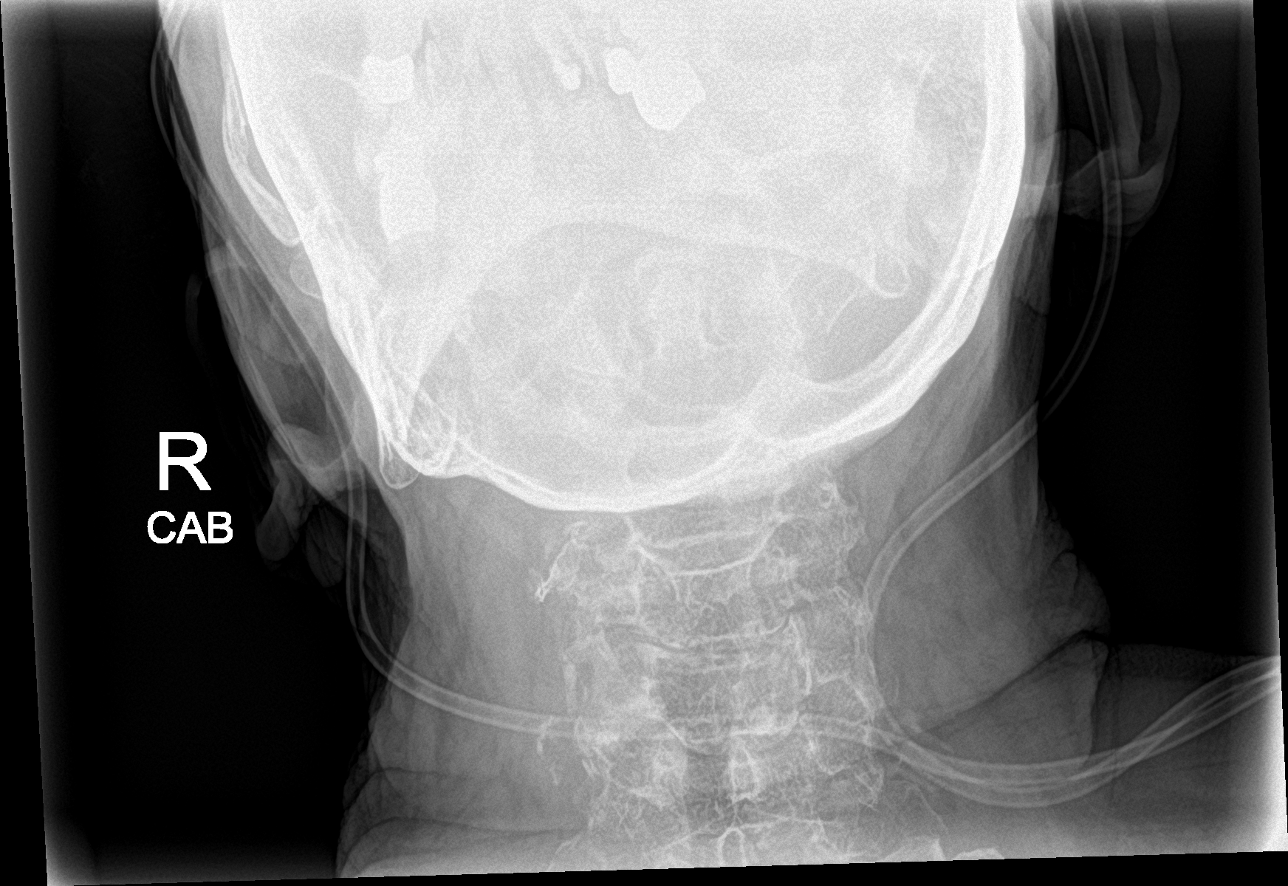
[im 4/4]
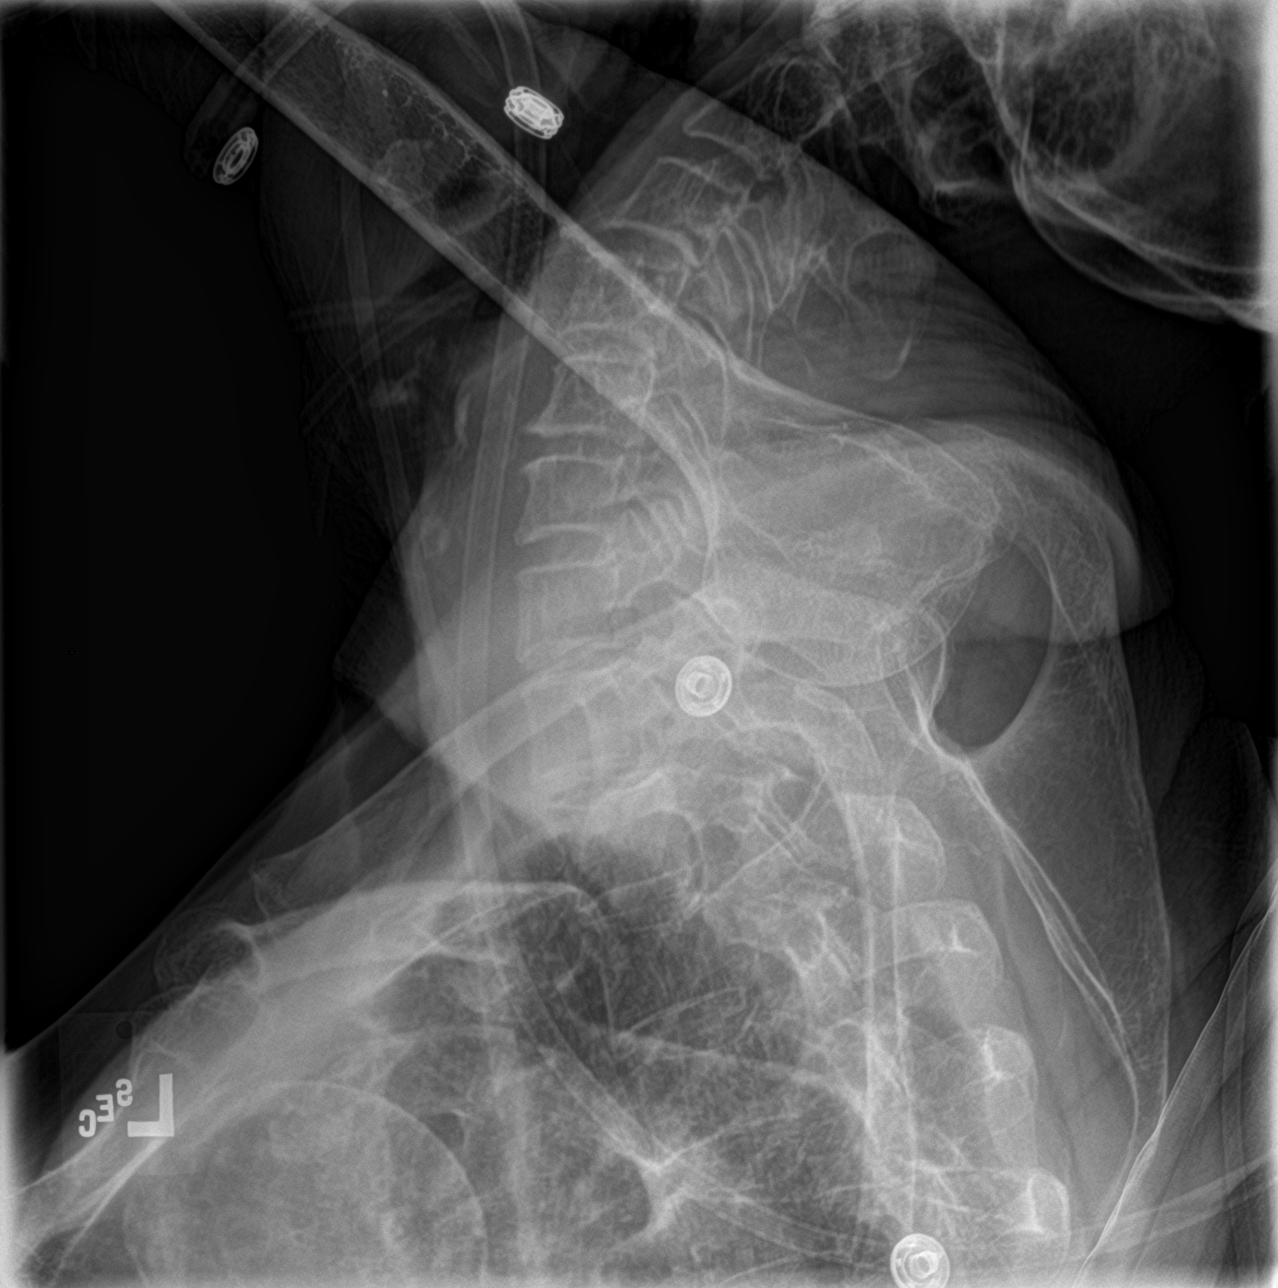

[4 of 4 positions shown; findings below may reference images not displayed]

FINDINGS: Bones appear osteopenic. There is normal alignment C1 through T1. No
acute fracture or subluxation. Prevertebral soft tissues have a
normal appearance. Lung apices are clear. There is atherosclerotic
calcification of the carotid bulb regions.
IMPRESSION: 1. Normal alignment.  No evidence for acute abnormality.
2. Carotid bulb atherosclerosis.
